# Patient Record
Sex: Female | Born: 1975 | Race: White | Hispanic: No | Marital: Married | Smoking: Never smoker
Health system: Southern US, Community
[De-identification: ages and names within clinical notes are randomized; demographics above are authoritative.]

## PROBLEM LIST (undated history)

## (undated) DIAGNOSIS — F329 Major depressive disorder, single episode, unspecified: Secondary | ICD-10-CM

## (undated) DIAGNOSIS — B977 Papillomavirus as the cause of diseases classified elsewhere: Secondary | ICD-10-CM

## (undated) DIAGNOSIS — F419 Anxiety disorder, unspecified: Secondary | ICD-10-CM

## (undated) DIAGNOSIS — G4730 Sleep apnea, unspecified: Secondary | ICD-10-CM

## (undated) DIAGNOSIS — F32A Depression, unspecified: Secondary | ICD-10-CM

## (undated) DIAGNOSIS — R87629 Unspecified abnormal cytological findings in specimens from vagina: Secondary | ICD-10-CM

## (undated) DIAGNOSIS — F319 Bipolar disorder, unspecified: Secondary | ICD-10-CM

## (undated) DIAGNOSIS — K589 Irritable bowel syndrome without diarrhea: Secondary | ICD-10-CM

## (undated) DIAGNOSIS — R002 Palpitations: Secondary | ICD-10-CM

## (undated) DIAGNOSIS — D649 Anemia, unspecified: Secondary | ICD-10-CM

## (undated) HISTORY — PX: COLONOSCOPY: SHX174

## (undated) HISTORY — DX: Sleep apnea, unspecified: G47.30

---

## 2001-02-16 ENCOUNTER — Other Ambulatory Visit: Admission: RE | Admit: 2001-02-16 | Discharge: 2001-02-16 | Payer: Self-pay | Admitting: Obstetrics and Gynecology

## 2002-02-23 ENCOUNTER — Other Ambulatory Visit: Admission: RE | Admit: 2002-02-23 | Discharge: 2002-02-23 | Payer: Self-pay | Admitting: Obstetrics and Gynecology

## 2003-03-08 ENCOUNTER — Other Ambulatory Visit: Admission: RE | Admit: 2003-03-08 | Discharge: 2003-03-08 | Payer: Self-pay | Admitting: Obstetrics and Gynecology

## 2012-01-16 ENCOUNTER — Other Ambulatory Visit: Payer: Self-pay | Admitting: Orthopedic Surgery

## 2012-01-21 ENCOUNTER — Encounter (HOSPITAL_BASED_OUTPATIENT_CLINIC_OR_DEPARTMENT_OTHER): Payer: Self-pay | Admitting: *Deleted

## 2012-01-22 ENCOUNTER — Encounter (HOSPITAL_BASED_OUTPATIENT_CLINIC_OR_DEPARTMENT_OTHER): Payer: Self-pay | Admitting: *Deleted

## 2012-01-22 ENCOUNTER — Encounter (HOSPITAL_BASED_OUTPATIENT_CLINIC_OR_DEPARTMENT_OTHER)
Admission: RE | Disposition: A | Discharge status: Home / Self Care | Payer: Self-pay | Source: Ambulatory Visit | Attending: Orthopedic Surgery

## 2012-01-22 ENCOUNTER — Encounter (HOSPITAL_BASED_OUTPATIENT_CLINIC_OR_DEPARTMENT_OTHER): Payer: Self-pay | Admitting: Orthopedic Surgery

## 2012-01-22 ENCOUNTER — Ambulatory Visit (HOSPITAL_BASED_OUTPATIENT_CLINIC_OR_DEPARTMENT_OTHER): Payer: Managed Care, Other (non HMO) | Admitting: *Deleted

## 2012-01-22 ENCOUNTER — Ambulatory Visit (HOSPITAL_BASED_OUTPATIENT_CLINIC_OR_DEPARTMENT_OTHER)
Admission: RE | Admit: 2012-01-22 | Discharge: 2012-01-22 | Disposition: A | Discharge status: Home / Self Care | Payer: Managed Care, Other (non HMO) | Source: Ambulatory Visit | Attending: Orthopedic Surgery | Admitting: Orthopedic Surgery

## 2012-01-22 DIAGNOSIS — Z880 Allergy status to penicillin: Secondary | ICD-10-CM | POA: Insufficient documentation

## 2012-01-22 DIAGNOSIS — G5600 Carpal tunnel syndrome, unspecified upper limb: Secondary | ICD-10-CM | POA: Insufficient documentation

## 2012-01-22 DIAGNOSIS — F329 Major depressive disorder, single episode, unspecified: Secondary | ICD-10-CM | POA: Insufficient documentation

## 2012-01-22 DIAGNOSIS — F3289 Other specified depressive episodes: Secondary | ICD-10-CM | POA: Insufficient documentation

## 2012-01-22 DIAGNOSIS — F411 Generalized anxiety disorder: Secondary | ICD-10-CM | POA: Insufficient documentation

## 2012-01-22 HISTORY — DX: Anxiety disorder, unspecified: F41.9

## 2012-01-22 HISTORY — PX: CARPAL TUNNEL RELEASE: SHX101

## 2012-01-22 HISTORY — DX: Anemia, unspecified: D64.9

## 2012-01-22 HISTORY — DX: Depression, unspecified: F32.A

## 2012-01-22 HISTORY — DX: Major depressive disorder, single episode, unspecified: F32.9

## 2012-01-22 SURGERY — CARPAL TUNNEL RELEASE
Anesthesia: Choice | Site: Hand | Laterality: Right | Wound class: Clean

## 2012-01-22 MED ORDER — DEXAMETHASONE SODIUM PHOSPHATE 4 MG/ML IJ SOLN
INTRAMUSCULAR | Status: DC | PRN
  Administered 2012-01-22: 10 mg via INTRAVENOUS

## 2012-01-22 MED ORDER — FENTANYL CITRATE 0.05 MG/ML IJ SOLN: 25.0000 ug | INTRAMUSCULAR | Status: DC | PRN

## 2012-01-22 MED ORDER — HYDROCODONE-ACETAMINOPHEN 5-325 MG PO TABS: ORAL_TABLET | ORAL | Status: DC

## 2012-01-22 MED ORDER — CHLORHEXIDINE GLUCONATE 4 % EX LIQD: 60.0000 mL | Freq: Once | CUTANEOUS | Status: DC

## 2012-01-22 MED ORDER — PROPOFOL 10 MG/ML IV BOLUS
INTRAVENOUS | Status: DC | PRN
  Administered 2012-01-22: 200 mg via INTRAVENOUS

## 2012-01-22 MED ORDER — METOCLOPRAMIDE HCL 5 MG/ML IJ SOLN
INTRAMUSCULAR | Status: DC | PRN
  Administered 2012-01-22: 10 mg via INTRAVENOUS

## 2012-01-22 MED ORDER — GLYCOPYRROLATE 0.2 MG/ML IJ SOLN
0.2000 mg | Freq: Once | INTRAMUSCULAR | Status: AC | PRN
  Administered 2012-01-22: 0.2 mg via INTRAVENOUS

## 2012-01-22 MED ORDER — BUPIVACAINE HCL (PF) 0.25 % IJ SOLN
INTRAMUSCULAR | Status: DC | PRN
  Administered 2012-01-22: 9 mL

## 2012-01-22 MED ORDER — VANCOMYCIN HCL 1000 MG IV SOLR
1000.0000 mg | INTRAVENOUS | Status: DC | PRN
  Administered 2012-01-22: 1000 mg via INTRAVENOUS

## 2012-01-22 MED ORDER — OXYCODONE HCL 5 MG PO TABS: 5.0000 mg | ORAL_TABLET | Freq: Once | ORAL | Status: DC | PRN

## 2012-01-22 MED ORDER — METOCLOPRAMIDE HCL 5 MG/ML IJ SOLN: 10.0000 mg | Freq: Once | INTRAMUSCULAR | Status: DC | PRN

## 2012-01-22 MED ORDER — ONDANSETRON HCL 4 MG/2ML IJ SOLN
INTRAMUSCULAR | Status: DC | PRN
  Administered 2012-01-22 (×2): 4 mg via INTRAVENOUS

## 2012-01-22 MED ORDER — OXYCODONE HCL 5 MG/5ML PO SOLN: 5.0000 mg | Freq: Once | ORAL | Status: DC | PRN

## 2012-01-22 MED ORDER — LIDOCAINE HCL (CARDIAC) 20 MG/ML IV SOLN
INTRAVENOUS | Status: DC | PRN
  Administered 2012-01-22: 50 mg via INTRAVENOUS

## 2012-01-22 MED ORDER — LACTATED RINGERS IV SOLN
INTRAVENOUS | Status: DC
  Administered 2012-01-22 (×3): via INTRAVENOUS

## 2012-01-22 MED ORDER — FENTANYL CITRATE 0.05 MG/ML IJ SOLN
INTRAMUSCULAR | Status: DC | PRN
  Administered 2012-01-22 (×2): 50 ug via INTRAVENOUS

## 2012-01-22 MED ORDER — MIDAZOLAM HCL 5 MG/5ML IJ SOLN
INTRAMUSCULAR | Status: DC | PRN
  Administered 2012-01-22: 2 mg via INTRAVENOUS

## 2012-01-22 SURGICAL SUPPLY — 36 items
BANDAGE ELASTIC 3 VELCRO ST LF (GAUZE/BANDAGES/DRESSINGS) ×2 IMPLANT
BANDAGE GAUZE ELAST BULKY 4 IN (GAUZE/BANDAGES/DRESSINGS) ×2 IMPLANT
BLADE MINI RND TIP GREEN BEAV (BLADE) IMPLANT
BLADE SURG 15 STRL LF DISP TIS (BLADE) ×2 IMPLANT
BLADE SURG 15 STRL SS (BLADE) ×2
BNDG ESMARK 4X9 LF (GAUZE/BANDAGES/DRESSINGS) ×2 IMPLANT
CHLORAPREP W/TINT 26ML (MISCELLANEOUS) ×2 IMPLANT
CLOTH BEACON ORANGE TIMEOUT ST (SAFETY) ×2 IMPLANT
CORDS BIPOLAR (ELECTRODE) ×2 IMPLANT
COVER MAYO STAND STRL (DRAPES) ×2 IMPLANT
COVER TABLE BACK 60X90 (DRAPES) ×2 IMPLANT
CUFF TOURNIQUET SINGLE 18IN (TOURNIQUET CUFF) ×2 IMPLANT
DRAPE EXTREMITY T 121X128X90 (DRAPE) ×2 IMPLANT
DRAPE SURG 17X23 STRL (DRAPES) ×2 IMPLANT
DRSG PAD ABDOMINAL 8X10 ST (GAUZE/BANDAGES/DRESSINGS) ×2 IMPLANT
GAUZE XEROFORM 1X8 LF (GAUZE/BANDAGES/DRESSINGS) ×2 IMPLANT
GLOVE BIO SURGEON STRL SZ 6.5 (GLOVE) ×2 IMPLANT
GLOVE BIO SURGEON STRL SZ7.5 (GLOVE) ×2 IMPLANT
GLOVE BIOGEL PI IND STRL 8 (GLOVE) ×1 IMPLANT
GLOVE BIOGEL PI INDICATOR 8 (GLOVE) ×1
GLOVE INDICATOR 7.0 STRL GRN (GLOVE) ×2 IMPLANT
GOWN PREVENTION PLUS XLARGE (GOWN DISPOSABLE) ×2 IMPLANT
GOWN STRL REIN XL XLG (GOWN DISPOSABLE) ×2 IMPLANT
NEEDLE HYPO 25X1 1.5 SAFETY (NEEDLE) ×2 IMPLANT
NS IRRIG 1000ML POUR BTL (IV SOLUTION) ×2 IMPLANT
PACK BASIN DAY SURGERY FS (CUSTOM PROCEDURE TRAY) ×2 IMPLANT
PADDING CAST ABS 4INX4YD NS (CAST SUPPLIES) ×1
PADDING CAST ABS COTTON 4X4 ST (CAST SUPPLIES) ×1 IMPLANT
SPONGE GAUZE 4X4 12PLY (GAUZE/BANDAGES/DRESSINGS) ×2 IMPLANT
STOCKINETTE 4X48 STRL (DRAPES) ×2 IMPLANT
SUT ETHILON 4 0 PS 2 18 (SUTURE) ×2 IMPLANT
SYR BULB 3OZ (MISCELLANEOUS) ×2 IMPLANT
SYR CONTROL 10ML LL (SYRINGE) ×2 IMPLANT
TOWEL OR 17X24 6PK STRL BLUE (TOWEL DISPOSABLE) ×4 IMPLANT
UNDERPAD 30X30 INCONTINENT (UNDERPADS AND DIAPERS) ×2 IMPLANT
WATER STERILE IRR 1000ML POUR (IV SOLUTION) IMPLANT

## 2012-01-22 NOTE — Anesthesia Procedure Notes (Signed)
Procedure Name: LMA Insertion Date/Time: 01/22/2012 8:55 AM Performed by: Gar Gibbon Pre-anesthesia Checklist: Patient identified, Emergency Drugs available, Suction available and Patient being monitored Patient Re-evaluated:Patient Re-evaluated prior to inductionOxygen Delivery Method: Circle System Utilized Preoxygenation: Pre-oxygenation with 100% oxygen Intubation Type: IV induction Ventilation: Mask ventilation without difficulty LMA: LMA inserted LMA Size: 4.0 Number of attempts: 1 Airway Equipment and Method: bite block Placement Confirmation: positive ETCO2 Tube secured with: Tape Dental Injury: Teeth and Oropharynx as per pre-operative assessment

## 2012-01-22 NOTE — Op Note (Signed)
Dictation [phone]

## 2012-01-22 NOTE — Transfer of Care (Signed)
Immediate Anesthesia Transfer of Care Note  Patient: Stacey Holmes  Procedure(s) Performed: Procedure(s) (LRB) with comments: CARPAL TUNNEL RELEASE (Right) - right carpal tunnel release  Patient Location: PACU  Anesthesia Type: General  Level of Consciousness: sedated  Airway & Oxygen Therapy: Patient Spontanous Breathing and Patient connected to face mask oxygen  Post-op Assessment: Report given to PACU RN  Post vital signs: Reviewed and stable  Complications: No apparent anesthesia complications

## 2012-01-22 NOTE — H&P (Signed)
  Stacey Holmes is an 36 y.o. female.   Chief Complaint: carpal tunnel syndrome HPI: 36 yo rhd female with numbness in thumb, index, long fingers x 2 years.  Progressively worsening.  Nocturnal symptoms 4 times per week.  Positive nerve conduction studies.  Past Medical History  Diagnosis Date  . Anxiety   . Depression   . Anemia     History reviewed. No pertinent past surgical history.  History reviewed. No pertinent family history. Social History:  reports that she has never smoked. She does not have any smokeless tobacco history on file. She reports that she drinks alcohol. She reports that she does not use illicit drugs.  Allergies:  Allergies  Allergen Reactions  . Penicillins Rash    Medications Prior to Admission  Medication Sig Dispense Refill  . carbamazepine (TEGRETOL XR) 400 MG 12 hr tablet Take 400 mg by mouth 2 (two) times daily.      Marland Kitchen escitalopram (LEXAPRO) 20 MG tablet Take 20 mg by mouth daily.        No results found for this or any previous visit (from the past 48 hour(s)).  No results found.   A comprehensive review of systems was negative except for: Eyes: positive for contacts/glasses Behavioral/Psych: positive for depression  Blood pressure 106/71, pulse 67, temperature 97.7 F (36.5 C), temperature source Oral, resp. rate 18, height 5\' 10"  (1.778 m), weight 70.024 kg (154 lb 6 oz), last menstrual period 01/15/2012, SpO2 100.00%.  General appearance: alert, cooperative and appears stated age Head: Normocephalic, without obvious abnormality, atraumatic Neck: supple, symmetrical, trachea midline Resp: clear to auscultation bilaterally Cardio: regular rate and rhythm GI: soft, non-tender; bowel sounds normal; no masses,  no organomegaly Extremities: intact sensation and capillary refill.  +epl/fpl/io.  positive durkins test. Pulses: 2+ and symmetric Skin: Skin color, texture, turgor normal. No rashes or lesions Neurologic: Grossly  normal Incision/Wound: na  Assessment/Plan Right carpal tunnel syndrome.  Discussed non operative and operative treatment options.  Patient wishes to have right carpal tunnel release.  Risks, benefits, and alternatives of surgery were discussed and the patient agrees with the plan of care.   [name] R 01/22/2012, 8:43 AM

## 2012-01-22 NOTE — Anesthesia Preprocedure Evaluation (Signed)
Anesthesia Evaluation  Patient identified by MRN, date of birth, ID band Patient awake    Reviewed: Allergy & Precautions, H&P , NPO status , Patient's Chart, lab work & pertinent test results, reviewed documented beta blocker date and time   Airway Mallampati: II TM Distance: >3 FB Neck ROM: full    Dental   Pulmonary neg pulmonary ROS,  breath sounds clear to auscultation        Cardiovascular negative cardio ROS  Rhythm:regular     Neuro/Psych PSYCHIATRIC DISORDERS negative neurological ROS     GI/Hepatic negative GI ROS, Neg liver ROS,   Endo/Other  negative endocrine ROS  Renal/GU negative Renal ROS  negative genitourinary   Musculoskeletal   Abdominal   Peds  Hematology negative hematology ROS (+)   Anesthesia Other Findings See surgeon's H&P   Reproductive/Obstetrics negative OB ROS                           Anesthesia Physical Anesthesia Plan  ASA: II  Anesthesia Plan: General   Post-op Pain Management:    Induction: Intravenous  Airway Management Planned: LMA  Additional Equipment:   Intra-op Plan:   Post-operative Plan: Extubation in OR  Informed Consent: I have reviewed the patients History and Physical, chart, labs and discussed the procedure including the risks, benefits and alternatives for the proposed anesthesia with the patient or authorized representative who has indicated his/her understanding and acceptance.   Dental Advisory Given  Plan Discussed with: CRNA and Surgeon  Anesthesia Plan Comments:         Anesthesia Quick Evaluation  

## 2012-01-22 NOTE — Anesthesia Postprocedure Evaluation (Signed)
Anesthesia Post Note  Patient: Stacey Holmes  Procedure(s) Performed: Procedure(s) (LRB): CARPAL TUNNEL RELEASE (Right)  Anesthesia type: General  Patient location: PACU  Post pain: Pain level controlled  Post assessment: Patient's Cardiovascular Status Stable  Last Vitals:  Filed Vitals:   01/22/12 1031  BP: 100/67  Pulse: 69  Temp: 36.6 C  Resp: 16    Post vital signs: Reviewed and stable  Level of consciousness: alert  Complications: No apparent anesthesia complications

## 2012-01-23 ENCOUNTER — Encounter (HOSPITAL_BASED_OUTPATIENT_CLINIC_OR_DEPARTMENT_OTHER): Payer: Self-pay | Admitting: Orthopedic Surgery

## 2012-01-23 NOTE — Op Note (Signed)
Stacey Holmes, Stacey Holmes NO.:  [id number]  MEDICAL RECORD NO.:  [id number]  LOCATION:                                 FACILITY:  PHYSICIAN:  Betha Loa, MD             DATE OF BIRTH:  DATE OF PROCEDURE:  01/22/2012 DATE OF DISCHARGE:                              OPERATIVE REPORT   PREOPERATIVE DIAGNOSIS:  Right carpal tunnel syndrome.  POSTOPERATIVE DIAGNOSIS:  Right carpal tunnel syndrome.  PROCEDURE:  Right carpal tunnel release.  SURGEON:  Betha Loa, MD  ASSISTANT:  None.  ANESTHESIA:  General.  IV FLUIDS:  Per anesthesia flow sheet.  ESTIMATED BLOOD LOSS:  Minimal.  COMPLICATIONS:  None.  SPECIMENS:  None.  TOURNIQUET TIME:  17 minutes.  DISPOSITION:  Stable to PACU.  INDICATIONS:  Ms. Stacey Holmes is a 36 year old right-hand dominant female who has had numbness in the thumb, index, and long fingers.  She has nocturnal symptoms.  She has positive nerve conduction studies.  She would like to have right carpal tunnel release for management of symptoms.  Risks, benefits, and alternatives of surgery were discussed including risk of blood loss, infection, damage to nerves, vessels, tendons, ligaments, bone, failure of surgery, need for additional surgery, complications with wound healing, continued pain, continued carpal tunnel syndrome.  She voiced understanding of these risks and elected to proceed.  OPERATIVE COURSE:  After being identified preoperatively by myself, the patient and I agreed upon the procedure and site of procedure.  Surgical site was marked.  The risks, benefits, and alternatives of surgery were reviewed, and she wished to proceed.  Surgical consent had been signed. She was given 1 g of IV vancomycin as preoperative antibiotic prophylaxis due to penicillin allergy.  She was transported to the operating room, placed on the operating table in a supine position with the right upper extremity on arm board.  General anesthesia was  induced by the anesthesiologist.  The right upper extremity was prepped and draped in normal sterile orthopedic fashion.  Surgical pause was performed between surgeons, anesthesia, and operating room staff, and all were in agreement as to the patient, procedure, and site of procedure.  Tourniquet at the proximal aspect of the forearm was inflated to 250 mmHg after exsanguination of the limb with an Esmarch bandage.  An incision was made longitudinally over the transverse carpal ligament, carried into subcutaneous tissues by spreading technique. Bipolar electrocautery was used to obtain hemostasis.  The transverse carpal ligament was identified and was incised sharply.  It was incised distally.  Care was taken to ensure complete decompression distally.  It was then incised in a proximal direction.  The distal aspect of the volar antebrachial fascia was split using the scissors.  A finger was placed into the wound to ensure complete decompression, which was the case.  The nerve was inspected.  It was adherent to the undersurface of the radial leaflet.  The motor branch was identified and was intact. The nerve was slightly hyperemic.  There were no masses noted.  The wound was copiously irrigated with sterile saline.  It was closed with 4- 0 nylon in a horizontal mattress fashion.  It was injected with 9 mL of 0.25% plain Marcaine to aid in postoperative analgesia.  The wound was then dressed with sterile Xeroform, 4 x 4s, and ABD and wrapped with Kerlix and Ace bandage.  Tourniquet was deflated at 17 minutes.  The fingertips were pink with brisk capillary refill after deflation of tourniquet.  Operative drapes were broken down.  The patient was awoken from anesthesia safely.  She was transferred back to stretcher and taken to PACU in stable condition.  I will see her in the office in 1 week for postoperative followup.  I will give her Norco 5/325 1-2 p.o. q.6 hours p.r.n. pain, dispensed  #30.     Betha Loa, MD     KK/MEDQ  D:  01/22/2012  T:  01/23/2012  Job:  [id number]

## 2016-07-15 ENCOUNTER — Other Ambulatory Visit (HOSPITAL_COMMUNITY): Payer: Self-pay | Admitting: Obstetrics and Gynecology

## 2016-07-15 DIAGNOSIS — Z319 Encounter for procreative management, unspecified: Secondary | ICD-10-CM

## 2016-07-21 ENCOUNTER — Other Ambulatory Visit (HOSPITAL_COMMUNITY): Payer: Self-pay | Admitting: Obstetrics and Gynecology

## 2016-07-21 ENCOUNTER — Ambulatory Visit (HOSPITAL_COMMUNITY)
Admission: RE | Admit: 2016-07-21 | Discharge: 2016-07-21 | Disposition: A | Discharge status: Home / Self Care | Payer: PRIVATE HEALTH INSURANCE | Source: Ambulatory Visit | Attending: Obstetrics and Gynecology | Admitting: Obstetrics and Gynecology

## 2016-07-21 DIAGNOSIS — Z319 Encounter for procreative management, unspecified: Secondary | ICD-10-CM

## 2016-07-21 DIAGNOSIS — N979 Female infertility, unspecified: Secondary | ICD-10-CM | POA: Insufficient documentation

## 2016-07-21 MED ORDER — IOPAMIDOL (ISOVUE-300) INJECTION 61%
30.0000 mL | Freq: Once | INTRAVENOUS | Status: AC | PRN
  Administered 2016-07-21: 30 mL via INTRAVENOUS

## 2016-08-18 ENCOUNTER — Other Ambulatory Visit (HOSPITAL_COMMUNITY): Payer: Self-pay | Admitting: Obstetrics and Gynecology

## 2016-08-18 DIAGNOSIS — Z319 Encounter for procreative management, unspecified: Secondary | ICD-10-CM

## 2016-08-20 ENCOUNTER — Ambulatory Visit (HOSPITAL_COMMUNITY)
Admission: RE | Admit: 2016-08-20 | Discharge: 2016-08-20 | Disposition: A | Discharge status: Home / Self Care | Payer: PRIVATE HEALTH INSURANCE | Source: Ambulatory Visit | Attending: Obstetrics and Gynecology | Admitting: Obstetrics and Gynecology

## 2016-08-20 DIAGNOSIS — Z319 Encounter for procreative management, unspecified: Secondary | ICD-10-CM | POA: Diagnosis present

## 2016-08-20 MED ORDER — IOPAMIDOL (ISOVUE-300) INJECTION 61%
30.0000 mL | Freq: Once | INTRAVENOUS | Status: AC | PRN
  Administered 2016-08-20: 30 mL

## 2016-12-25 ENCOUNTER — Other Ambulatory Visit (HOSPITAL_COMMUNITY): Payer: Self-pay | Admitting: Obstetrics and Gynecology

## 2016-12-25 DIAGNOSIS — Z36 Encounter for antenatal screening for chromosomal anomalies: Secondary | ICD-10-CM

## 2016-12-30 ENCOUNTER — Encounter (HOSPITAL_COMMUNITY): Payer: Self-pay | Admitting: *Deleted

## 2016-12-31 ENCOUNTER — Ambulatory Visit (HOSPITAL_COMMUNITY)
Admission: RE | Admit: 2016-12-31 | Discharge: 2016-12-31 | Disposition: A | Discharge status: Home / Self Care | Payer: PRIVATE HEALTH INSURANCE | Source: Ambulatory Visit | Attending: Obstetrics and Gynecology | Admitting: Obstetrics and Gynecology

## 2016-12-31 ENCOUNTER — Encounter (HOSPITAL_COMMUNITY): Payer: Self-pay

## 2016-12-31 ENCOUNTER — Other Ambulatory Visit (HOSPITAL_COMMUNITY): Payer: Self-pay | Admitting: Obstetrics and Gynecology

## 2016-12-31 ENCOUNTER — Encounter (HOSPITAL_COMMUNITY): Payer: Managed Care, Other (non HMO)

## 2016-12-31 DIAGNOSIS — O99341 Other mental disorders complicating pregnancy, first trimester: Secondary | ICD-10-CM | POA: Insufficient documentation

## 2016-12-31 DIAGNOSIS — Z36 Encounter for antenatal screening for chromosomal anomalies: Secondary | ICD-10-CM | POA: Diagnosis not present

## 2016-12-31 DIAGNOSIS — O09811 Supervision of pregnancy resulting from assisted reproductive technology, first trimester: Secondary | ICD-10-CM | POA: Insufficient documentation

## 2016-12-31 DIAGNOSIS — O09511 Supervision of elderly primigravida, first trimester: Secondary | ICD-10-CM | POA: Diagnosis not present

## 2016-12-31 DIAGNOSIS — Z3A12 12 weeks gestation of pregnancy: Secondary | ICD-10-CM | POA: Insufficient documentation

## 2016-12-31 DIAGNOSIS — O281 Abnormal biochemical finding on antenatal screening of mother: Secondary | ICD-10-CM | POA: Diagnosis not present

## 2016-12-31 DIAGNOSIS — O289 Unspecified abnormal findings on antenatal screening of mother: Secondary | ICD-10-CM | POA: Insufficient documentation

## 2016-12-31 HISTORY — DX: Irritable bowel syndrome, unspecified: K58.9

## 2016-12-31 HISTORY — DX: Bipolar disorder, unspecified: F31.9

## 2017-01-01 ENCOUNTER — Encounter (HOSPITAL_COMMUNITY): Payer: Self-pay

## 2017-01-01 ENCOUNTER — Other Ambulatory Visit: Payer: Self-pay | Admitting: Obstetrics and Gynecology

## 2017-01-02 ENCOUNTER — Encounter (HOSPITAL_COMMUNITY): Payer: Self-pay | Admitting: Anesthesiology

## 2017-01-02 ENCOUNTER — Ambulatory Visit (HOSPITAL_COMMUNITY)
Admission: RE | Admit: 2017-01-02 | Payer: PRIVATE HEALTH INSURANCE | Source: Ambulatory Visit | Admitting: Obstetrics and Gynecology

## 2017-01-02 HISTORY — DX: Palpitations: R00.2

## 2017-01-02 SURGERY — DILATION AND EVACUATION, UTERUS
Anesthesia: Choice

## 2017-01-14 ENCOUNTER — Encounter (HOSPITAL_COMMUNITY): Payer: Self-pay

## 2017-01-14 ENCOUNTER — Other Ambulatory Visit (HOSPITAL_COMMUNITY): Payer: Self-pay

## 2017-05-18 ENCOUNTER — Inpatient Hospital Stay (HOSPITAL_COMMUNITY)
Admission: AD | Admit: 2017-05-18 | Payer: PRIVATE HEALTH INSURANCE | Source: Ambulatory Visit | Admitting: Obstetrics and Gynecology

## 2017-10-06 ENCOUNTER — Encounter (HOSPITAL_COMMUNITY): Payer: Self-pay

## 2018-04-07 ENCOUNTER — Encounter (HOSPITAL_COMMUNITY): Payer: Self-pay

## 2018-04-13 LAB — OB RESULTS CONSOLE RPR: RPR: NONREACTIVE

## 2018-04-13 LAB — OB RESULTS CONSOLE RUBELLA ANTIBODY, IGM: Rubella: IMMUNE

## 2018-04-13 LAB — OB RESULTS CONSOLE ABO/RH: RH Type: POSITIVE

## 2018-04-13 LAB — OB RESULTS CONSOLE HEPATITIS B SURFACE ANTIGEN: Hepatitis B Surface Ag: NEGATIVE

## 2018-04-13 LAB — OB RESULTS CONSOLE HIV ANTIBODY (ROUTINE TESTING): HIV: NONREACTIVE

## 2018-04-13 LAB — OB RESULTS CONSOLE ANTIBODY SCREEN: Antibody Screen: NEGATIVE

## 2018-05-19 NOTE — L&D Delivery Note (Signed)
Delivery Note At 9:48 PM a viable and healthy female was delivered via Vaginal, Spontaneous (Presentation: LOA  ).  APGAR: 8, 9; weight pending .   Placenta status: manual removal, intact.  Cord:  with the following complications: [place] x 2 reduced.  Cord pH: na  Anesthesia:  epidural Episiotomy: None Lacerations: 2nd degree;Vaginal Suture Repair: 2.0 vicryl rapide Est. Blood Loss (mL): 131  Mom to postpartum.  Baby to Couplet care / Skin to Skin.  Salia Cangemi J 11/11/2018, 10:05 PM

## 2018-06-10 ENCOUNTER — Other Ambulatory Visit: Payer: Self-pay

## 2018-06-18 ENCOUNTER — Encounter (HOSPITAL_COMMUNITY): Payer: Self-pay

## 2018-06-23 ENCOUNTER — Encounter (HOSPITAL_COMMUNITY): Payer: Self-pay

## 2018-06-23 ENCOUNTER — Other Ambulatory Visit (HOSPITAL_COMMUNITY): Payer: Self-pay | Admitting: Obstetrics and Gynecology

## 2018-06-23 DIAGNOSIS — O285 Abnormal chromosomal and genetic finding on antenatal screening of mother: Secondary | ICD-10-CM

## 2018-06-25 ENCOUNTER — Encounter (HOSPITAL_COMMUNITY): Payer: Self-pay

## 2018-06-25 ENCOUNTER — Encounter (HOSPITAL_COMMUNITY): Payer: PRIVATE HEALTH INSURANCE

## 2018-06-25 ENCOUNTER — Ambulatory Visit (HOSPITAL_COMMUNITY)
Admission: RE | Admit: 2018-06-25 | Discharge: 2018-06-25 | Disposition: A | Discharge status: Home / Self Care | Payer: 59 | Source: Ambulatory Visit | Attending: Family Medicine | Admitting: Family Medicine

## 2018-06-25 DIAGNOSIS — O285 Abnormal chromosomal and genetic finding on antenatal screening of mother: Secondary | ICD-10-CM

## 2018-06-25 DIAGNOSIS — Z3A19 19 weeks gestation of pregnancy: Secondary | ICD-10-CM | POA: Diagnosis not present

## 2018-06-25 DIAGNOSIS — Z363 Encounter for antenatal screening for malformations: Secondary | ICD-10-CM

## 2018-06-25 DIAGNOSIS — O09299 Supervision of pregnancy with other poor reproductive or obstetric history, unspecified trimester: Secondary | ICD-10-CM | POA: Diagnosis not present

## 2018-06-25 HISTORY — DX: Bipolar disorder, unspecified: F31.9

## 2018-06-25 HISTORY — DX: Papillomavirus as the cause of diseases classified elsewhere: B97.7

## 2018-06-25 HISTORY — DX: Unspecified abnormal cytological findings in specimens from vagina: R87.629

## 2018-10-19 LAB — OB RESULTS CONSOLE GBS: GBS: NEGATIVE

## 2018-11-02 ENCOUNTER — Other Ambulatory Visit: Payer: Self-pay | Admitting: Obstetrics and Gynecology

## 2018-11-02 ENCOUNTER — Telehealth (HOSPITAL_COMMUNITY): Payer: Self-pay | Admitting: *Deleted

## 2018-11-02 NOTE — Telephone Encounter (Signed)
Preadmission screen  

## 2018-11-03 ENCOUNTER — Encounter (HOSPITAL_COMMUNITY): Payer: Self-pay | Admitting: *Deleted

## 2018-11-03 ENCOUNTER — Telehealth (HOSPITAL_COMMUNITY): Payer: Self-pay | Admitting: *Deleted

## 2018-11-03 NOTE — Telephone Encounter (Signed)
Preadmission screen  

## 2018-11-09 ENCOUNTER — Other Ambulatory Visit: Payer: Self-pay | Admitting: Obstetrics and Gynecology

## 2018-11-10 ENCOUNTER — Other Ambulatory Visit: Payer: Self-pay

## 2018-11-10 ENCOUNTER — Other Ambulatory Visit (HOSPITAL_COMMUNITY)
Admission: RE | Admit: 2018-11-10 | Discharge: 2018-11-10 | Disposition: A | Discharge status: Home / Self Care | Payer: 59 | Source: Ambulatory Visit | Attending: Obstetrics | Admitting: Obstetrics

## 2018-11-10 DIAGNOSIS — Z1159 Encounter for screening for other viral diseases: Secondary | ICD-10-CM | POA: Diagnosis not present

## 2018-11-10 LAB — SARS CORONAVIRUS 2 (TAT 6-24 HRS): SARS Coronavirus 2: NEGATIVE

## 2018-11-10 NOTE — MAU Note (Signed)
Covid swab collected. PT tolerated well.Pt asymptomatic

## 2018-11-11 ENCOUNTER — Inpatient Hospital Stay (HOSPITAL_COMMUNITY): Payer: 59 | Admitting: Anesthesiology

## 2018-11-11 ENCOUNTER — Other Ambulatory Visit: Payer: Self-pay

## 2018-11-11 ENCOUNTER — Encounter (HOSPITAL_COMMUNITY): Payer: Self-pay

## 2018-11-11 ENCOUNTER — Inpatient Hospital Stay (HOSPITAL_COMMUNITY)
Admission: AD | Admit: 2018-11-11 | Discharge: 2018-11-13 | DRG: 807 | Disposition: A | Discharge status: Home / Self Care | Payer: 59 | Attending: Obstetrics and Gynecology | Admitting: Obstetrics and Gynecology

## 2018-11-11 DIAGNOSIS — O26893 Other specified pregnancy related conditions, third trimester: Secondary | ICD-10-CM | POA: Diagnosis present

## 2018-11-11 DIAGNOSIS — Z3A39 39 weeks gestation of pregnancy: Secondary | ICD-10-CM | POA: Diagnosis not present

## 2018-11-11 DIAGNOSIS — O4292 Full-term premature rupture of membranes, unspecified as to length of time between rupture and onset of labor: Secondary | ICD-10-CM

## 2018-11-11 DIAGNOSIS — Z3689 Encounter for other specified antenatal screening: Secondary | ICD-10-CM

## 2018-11-11 LAB — CBC
HCT: 47.2 % — ABNORMAL HIGH (ref 36.0–46.0)
Hemoglobin: 16.2 g/dL — ABNORMAL HIGH (ref 12.0–15.0)
MCH: 34.8 pg — ABNORMAL HIGH (ref 26.0–34.0)
MCHC: 34.3 g/dL (ref 30.0–36.0)
MCV: 101.5 fL — ABNORMAL HIGH (ref 80.0–100.0)
Platelets: 128 10*3/uL — ABNORMAL LOW (ref 150–400)
RBC: 4.65 MIL/uL (ref 3.87–5.11)
RDW: 13 % (ref 11.5–15.5)
WBC: 7.2 10*3/uL (ref 4.0–10.5)
nRBC: 0 % (ref 0.0–0.2)

## 2018-11-11 LAB — TYPE AND SCREEN
ABO/RH(D): A POS
Antibody Screen: NEGATIVE

## 2018-11-11 LAB — AMNISURE RUPTURE OF MEMBRANE (ROM) NOT AT ARMC: Amnisure ROM: POSITIVE

## 2018-11-11 LAB — POCT FERN TEST: POCT Fern Test: NEGATIVE

## 2018-11-11 LAB — ABO/RH: ABO/RH(D): A POS

## 2018-11-11 MED ORDER — LIDOCAINE-EPINEPHRINE (PF) 2 %-1:200000 IJ SOLN
INTRAMUSCULAR | Status: DC | PRN
  Administered 2018-11-11 (×2): 3 mL via EPIDURAL

## 2018-11-11 MED ORDER — TERBUTALINE SULFATE 1 MG/ML IJ SOLN: 0.2500 mg | Freq: Once | INTRAMUSCULAR | Status: DC | PRN

## 2018-11-11 MED ORDER — PHENYLEPHRINE 40 MCG/ML (10ML) SYRINGE FOR IV PUSH (FOR BLOOD PRESSURE SUPPORT): 80.0000 ug | PREFILLED_SYRINGE | INTRAVENOUS | Status: DC | PRN

## 2018-11-11 MED ORDER — LIDOCAINE HCL (PF) 1 % IJ SOLN
INTRAMUSCULAR | Status: AC
  Administered 2018-11-11: 30 mL
  Filled 2018-11-11: qty 30

## 2018-11-11 MED ORDER — LACTATED RINGERS IV SOLN
INTRAVENOUS | Status: DC
  Administered 2018-11-11 (×2): via INTRAVENOUS

## 2018-11-11 MED ORDER — EPHEDRINE 5 MG/ML INJ: 10.0000 mg | INTRAVENOUS | Status: DC | PRN

## 2018-11-11 MED ORDER — CEFAZOLIN SODIUM-DEXTROSE 2-4 GM/100ML-% IV SOLN
2.0000 g | Freq: Three times a day (TID) | INTRAVENOUS | Status: DC
  Administered 2018-11-11: 2 g via INTRAVENOUS
  Filled 2018-11-11 (×2): qty 100

## 2018-11-11 MED ORDER — OXYTOCIN BOLUS FROM INFUSION
500.0000 mL | Freq: Once | INTRAVENOUS | Status: AC
  Administered 2018-11-11: 500 mL via INTRAVENOUS

## 2018-11-11 MED ORDER — ACETAMINOPHEN 325 MG PO TABS: 650.0000 mg | ORAL_TABLET | ORAL | Status: DC | PRN

## 2018-11-11 MED ORDER — FLEET ENEMA 7-19 GM/118ML RE ENEM: 1.0000 | ENEMA | RECTAL | Status: DC | PRN

## 2018-11-11 MED ORDER — FENTANYL-BUPIVACAINE-NACL 0.5-0.125-0.9 MG/250ML-% EP SOLN
12.0000 mL/h | EPIDURAL | Status: DC | PRN
  Filled 2018-11-11: qty 250

## 2018-11-11 MED ORDER — IBUPROFEN 600 MG PO TABS
600.0000 mg | ORAL_TABLET | Freq: Four times a day (QID) | ORAL | Status: DC
  Administered 2018-11-11 – 2018-11-13 (×8): 600 mg via ORAL
  Filled 2018-11-11 (×8): qty 1

## 2018-11-11 MED ORDER — DIPHENHYDRAMINE HCL 50 MG/ML IJ SOLN: 12.5000 mg | INTRAMUSCULAR | Status: DC | PRN

## 2018-11-11 MED ORDER — OXYTOCIN 40 UNITS IN NORMAL SALINE INFUSION - SIMPLE MED
2.5000 [IU]/h | INTRAVENOUS | Status: DC
  Administered 2018-11-11: 2.5 [IU]/h via INTRAVENOUS

## 2018-11-11 MED ORDER — LACTATED RINGERS IV SOLN: 500.0000 mL | INTRAVENOUS | Status: DC | PRN

## 2018-11-11 MED ORDER — OXYTOCIN 40 UNITS IN NORMAL SALINE INFUSION - SIMPLE MED
1.0000 m[IU]/min | INTRAVENOUS | Status: DC
  Administered 2018-11-11: 2 m[IU]/min via INTRAVENOUS
  Filled 2018-11-11: qty 1000

## 2018-11-11 MED ORDER — SODIUM CHLORIDE (PF) 0.9 % IJ SOLN
INTRAMUSCULAR | Status: DC | PRN
  Administered 2018-11-11: 12 mL/h via EPIDURAL

## 2018-11-11 MED ORDER — SOD CITRATE-CITRIC ACID 500-334 MG/5ML PO SOLN: 30.0000 mL | ORAL | Status: DC | PRN

## 2018-11-11 MED ORDER — LACTATED RINGERS IV SOLN
500.0000 mL | Freq: Once | INTRAVENOUS | Status: AC
  Administered 2018-11-11: 500 mL via INTRAVENOUS

## 2018-11-11 MED ORDER — OXYCODONE-ACETAMINOPHEN 5-325 MG PO TABS: 2.0000 | ORAL_TABLET | ORAL | Status: DC | PRN

## 2018-11-11 MED ORDER — ONDANSETRON HCL 4 MG/2ML IJ SOLN: 4.0000 mg | Freq: Four times a day (QID) | INTRAMUSCULAR | Status: DC | PRN

## 2018-11-11 MED ORDER — LIDOCAINE HCL (PF) 1 % IJ SOLN: 30.0000 mL | INTRAMUSCULAR | Status: DC | PRN

## 2018-11-11 MED ORDER — OXYCODONE-ACETAMINOPHEN 5-325 MG PO TABS: 1.0000 | ORAL_TABLET | ORAL | Status: DC | PRN

## 2018-11-11 NOTE — H&P (Signed)
Stacey Holmes is a 43 y.o. female presenting for SROM at term. OB History    Gravida  2   Para      Term      Preterm      AB  1   Living  0     SAB  1   TAB      Ectopic      Multiple      Live Births             Past Medical History:  Diagnosis Date  . Anemia   . Anxiety   . Bipolar depression ([place])   . Bipolar disorder ([place])   . Depression   . Heart palpitations   . HPV in female   . IBS (irritable bowel syndrome)   . Vaginal Pap smear, abnormal    Past Surgical History:  Procedure Laterality Date  . CARPAL TUNNEL RELEASE  01/22/2012   Procedure: CARPAL TUNNEL RELEASE;  Surgeon: Tennis Must, MD;  Location: [facility];  Service: Orthopedics;  Laterality: Right;  right carpal tunnel release   Family History: family history includes Hypertension in her mother. Social History:  reports that she has never smoked. She has never used smokeless tobacco. She reports previous alcohol use. She reports that she does not use drugs.     Maternal Diabetes: No Genetic Screening: Normal Maternal Ultrasounds/Referrals: Normal Fetal Ultrasounds or other Referrals:  None Maternal Substance Abuse:  No Significant Maternal Medications:  None Significant Maternal Lab Results:  None Other Comments:  None  Review of Systems  Constitutional: Negative.   All other systems reviewed and are negative.  Maternal Medical History:  Reason for admission: Rupture of membranes and contractions.   Contractions: Onset was 1-2 hours ago.   Perceived severity is moderate.    Fetal activity: Perceived fetal activity is normal.   Last perceived fetal movement was within the past hour.    Prenatal complications: no prenatal complications Prenatal Complications - Diabetes: none.    Dilation: 3.5 Effacement (%): 80 Station: -2, -1 Exam by:: Polos RN Blood pressure 104/83, pulse 69, temperature 98.9 F (37.2 C), temperature source Oral, resp. rate 16, height 5'  10" (1.778 m), weight 80.3 kg, last menstrual period 02/10/2018, unknown if currently breastfeeding. Maternal Exam:  Uterine Assessment: Contraction strength is moderate.  Contraction frequency is irregular.   Abdomen: Patient reports no abdominal tenderness. Fetal presentation: vertex  Introitus: Normal vulva. Normal vagina.  Ferning test: positive.  Nitrazine test: positive. Amniotic fluid character: clear.  Pelvis: adequate for delivery.   Cervix: Cervix evaluated by digital exam.     Physical Exam  Nursing note and vitals reviewed. Constitutional: She is oriented to person, place, and time. She appears well-developed and well-nourished.  HENT:  Head: Normocephalic and atraumatic.  Neck: Normal range of motion. Neck supple.  Cardiovascular: Normal rate and regular rhythm.  Respiratory: Effort normal and breath sounds normal.  GI: Soft. Bowel sounds are normal.  Genitourinary:    Vulva, vagina and uterus normal.   Musculoskeletal: Normal range of motion.  Neurological: She is alert and oriented to person, place, and time. She has normal reflexes.  Skin: Skin is warm and dry.  Psychiatric: She has a normal mood and affect.    Prenatal labs: ABO, Rh: --/--/A POS (06/25 1121) Antibody: NEG (06/25 1121) Rubella: Immune (11/26 0000) RPR: Nonreactive (11/26 0000)  HBsAg: Negative (11/26 0000)  HIV: Non-reactive (11/26 0000)  GBS: Negative (06/02 0000)  Assessment/Plan: Term IUP with SROM AMA Augment labor   Shaheed Schmuck J 11/11/2018, 1:37 PM

## 2018-11-11 NOTE — MAU Note (Signed)
Water broke at 6 am.  Clear fluid with slight pink. Baby moving well. No pain, no contractions.

## 2018-11-11 NOTE — Anesthesia Procedure Notes (Signed)
Epidural Patient location during procedure: OB Start time: 11/11/2018 5:05 PM End time: 11/11/2018 5:20 PM  Staffing Anesthesiologist: Freddrick March, MD Performed: anesthesiologist   Preanesthetic Checklist Completed: patient identified, pre-op evaluation, timeout performed, IV checked, risks and benefits discussed and monitors and equipment checked  Epidural Patient position: sitting Prep: site prepped and draped and DuraPrep Patient monitoring: continuous pulse ox, blood pressure, heart rate and cardiac monitor Approach: midline Location: L3-L4 Injection technique: LOR air  Needle:  Needle type: Tuohy  Needle gauge: 17 G Needle length: 9 cm Needle insertion depth: 5 cm Catheter type: closed end flexible Catheter size: 19 Gauge Catheter at skin depth: 11 cm Test dose: negative  Assessment Sensory level: T8 Events: blood not aspirated, injection not painful, no injection resistance, negative IV test and no paresthesia  Additional Notes Patient identified. Risks/Benefits/Options discussed with patient including but not limited to bleeding, infection, nerve damage, paralysis, failed block, incomplete pain control, headache, blood pressure changes, nausea, vomiting, reactions to medication both or allergic, itching and postpartum back pain. Confirmed with bedside nurse the patient's most recent platelet count. Confirmed with patient that they are not currently taking any anticoagulation, have any bleeding history or any family history of bleeding disorders. Patient expressed understanding and wished to proceed. All questions were answered. Sterile technique was used throughout the entire procedure. Please see nursing notes for vital signs. Test dose was given through epidural catheter and negative prior to continuing to dose epidural or start infusion. Warning signs of high block given to the patient including shortness of breath, tingling/numbness in hands, complete motor block,  or any concerning symptoms with instructions to call for help. Patient was given instructions on fall risk and not to get out of bed. All questions and concerns addressed with instructions to call with any issues or inadequate analgesia.  Reason for block:procedure for pain

## 2018-11-11 NOTE — Anesthesia Preprocedure Evaluation (Signed)
Anesthesia Evaluation  Patient identified by MRN, date of birth, ID band Patient awake    Reviewed: Allergy & Precautions, NPO status , Patient's Chart, lab work & pertinent test results  Airway Mallampati: II  TM Distance: >3 FB Neck ROM: Full    Dental no notable dental hx.    Pulmonary neg pulmonary ROS,    Pulmonary exam normal breath sounds clear to auscultation       Cardiovascular negative cardio ROS Normal cardiovascular exam Rhythm:Regular Rate:Normal     Neuro/Psych PSYCHIATRIC DISORDERS Anxiety Depression Bipolar Disorder negative neurological ROS     GI/Hepatic negative GI ROS, Neg liver ROS,   Endo/Other  negative endocrine ROS  Renal/GU negative Renal ROS  negative genitourinary   Musculoskeletal negative musculoskeletal ROS (+)   Abdominal   Peds  Hematology   Anesthesia Other Findings   Reproductive/Obstetrics (+) Pregnancy                             Anesthesia Physical Anesthesia Plan  ASA: II  Anesthesia Plan: Epidural   Post-op Pain Management:    Induction:   PONV Risk Score and Plan: Treatment may vary due to age or medical condition  Airway Management Planned: Natural Airway  Additional Equipment:   Intra-op Plan:   Post-operative Plan:   Informed Consent: I have reviewed the patients History and Physical, chart, labs and discussed the procedure including the risks, benefits and alternatives for the proposed anesthesia with the patient or authorized representative who has indicated his/her understanding and acceptance.       Plan Discussed with: Anesthesiologist  Anesthesia Plan Comments: (Patient identified. Risks, benefits, options discussed with patient including but not limited to bleeding, infection, nerve damage, paralysis, failed block, incomplete pain control, headache, blood pressure changes, nausea, vomiting, reactions to medication,  itching, and post partum back pain. Confirmed with bedside nurse the patient's most recent platelet count. Confirmed with the patient that they are not taking any anticoagulation, have any bleeding history or any family history of bleeding disorders. Patient expressed understanding and wishes to proceed. All questions were answered. )        Anesthesia Quick Evaluation

## 2018-11-11 NOTE — MAU Provider Note (Signed)
S: Ms. Stacey Holmes is a 43 y.o. G2P0010 at [redacted]w[redacted]d  who presents to MAU today complaining of leaking of fluid since 0600 today. She denies vaginal bleeding, but reports very minimal pink discharge after leaking of fluid. She denies contractions. She reports normal fetal movement.    O: BP 137/77 (BP Location: Right Arm)   Pulse 71   Temp 98.5 F (36.9 C) (Oral)   Resp 18   Ht 5\' 10"  (1.778 m)   Wt 80.3 kg   LMP 02/10/2018   BMI 25.40 kg/m  GENERAL: Well-developed, well-nourished female in no acute distress.  HEAD: Normocephalic, atraumatic.  CHEST: Normal effort of breathing, regular heart rate ABDOMEN: Soft, nontender, gravid PELVIC: Normal external female genitalia. Vagina is pink and rugated. Cervix with normal contour, no lesions. Normal discharge.  No pooling. No bleeding.  Cervical exam: 2.5/50/0    Fetal Monitoring: reactive with variables Baseline: 140/150 Variability: moderate Accelerations: present, 15x15 Decelerations: present, few, irregular variable Contractions: few, mild irregular  Results for orders placed or performed during the hospital encounter of 11/11/18 (from the past 24 hour(s))  Amnisure rupture of membrane (rom)not at [facility]     Status: None   Collection Time: 11/11/18  9:27 AM  Result Value Ref Range   Amnisure ROM POSITIVE   POCT fern test     Status: None   Collection Time: 11/11/18  9:58 AM  Result Value Ref Range   POCT Fern Test Negative = intact amniotic membranes     A: SIUP at [redacted]w[redacted]d No pooling Fern negative x2 Amnisure: POSITIVE (of note, nurse reported when she collected the AmniSure that the swab came out brown) SROM  P: Report given to RN to contact MD on call for further instructions  Meredyth Hornung, Gerrie Nordmann, NP 11/11/2018 10:30 AM

## 2018-11-12 ENCOUNTER — Inpatient Hospital Stay (HOSPITAL_COMMUNITY): Payer: 59

## 2018-11-12 ENCOUNTER — Inpatient Hospital Stay (HOSPITAL_COMMUNITY): Admission: AD | Admit: 2018-11-12 | Payer: 59 | Source: Home / Self Care | Admitting: Obstetrics and Gynecology

## 2018-11-12 LAB — CBC
HCT: 37.1 % (ref 36.0–46.0)
HCT: 33.7 % — ABNORMAL LOW (ref 36.0–46.0)
Hemoglobin: 12.6 g/dL (ref 12.0–15.0)
Hemoglobin: 11.3 g/dL — ABNORMAL LOW (ref 12.0–15.0)
MCH: 34.4 pg — ABNORMAL HIGH (ref 26.0–34.0)
MCH: 33.9 pg (ref 26.0–34.0)
MCHC: 34 g/dL (ref 30.0–36.0)
MCHC: 33.5 g/dL (ref 30.0–36.0)
MCV: 101.4 fL — ABNORMAL HIGH (ref 80.0–100.0)
MCV: 101.2 fL — ABNORMAL HIGH (ref 80.0–100.0)
Platelets: 138 10*3/uL — ABNORMAL LOW (ref 150–400)
Platelets: 130 10*3/uL — ABNORMAL LOW (ref 150–400)
RBC: 3.66 MIL/uL — ABNORMAL LOW (ref 3.87–5.11)
RBC: 3.33 MIL/uL — ABNORMAL LOW (ref 3.87–5.11)
RDW: 12.8 % (ref 11.5–15.5)
RDW: 12.9 % (ref 11.5–15.5)
WBC: 11.4 10*3/uL — ABNORMAL HIGH (ref 4.0–10.5)
WBC: 11.8 10*3/uL — ABNORMAL HIGH (ref 4.0–10.5)
nRBC: 0 % (ref 0.0–0.2)
nRBC: 0 % (ref 0.0–0.2)

## 2018-11-12 LAB — RPR: RPR Ser Ql: NONREACTIVE

## 2018-11-12 MED ORDER — WITCH HAZEL-GLYCERIN EX PADS
1.0000 "application " | MEDICATED_PAD | CUTANEOUS | Status: DC | PRN
  Administered 2018-11-12: 1 via TOPICAL

## 2018-11-12 MED ORDER — TETANUS-DIPHTH-ACELL PERTUSSIS 5-2.5-18.5 LF-MCG/0.5 IM SUSP: 0.5000 mL | Freq: Once | INTRAMUSCULAR | Status: DC

## 2018-11-12 MED ORDER — SIMETHICONE 80 MG PO CHEW: 80.0000 mg | CHEWABLE_TABLET | ORAL | Status: DC | PRN

## 2018-11-12 MED ORDER — COCONUT OIL OIL
1.0000 "application " | TOPICAL_OIL | Status: DC | PRN
  Administered 2018-11-13: 1 via TOPICAL

## 2018-11-12 MED ORDER — BENZOCAINE-MENTHOL 20-0.5 % EX AERO
1.0000 "application " | INHALATION_SPRAY | CUTANEOUS | Status: DC | PRN
  Administered 2018-11-12: 1 via TOPICAL
  Filled 2018-11-12: qty 56

## 2018-11-12 MED ORDER — ONDANSETRON HCL 4 MG/2ML IJ SOLN: 4.0000 mg | INTRAMUSCULAR | Status: DC | PRN

## 2018-11-12 MED ORDER — DIBUCAINE (PERIANAL) 1 % EX OINT: 1.0000 "application " | TOPICAL_OINTMENT | CUTANEOUS | Status: DC | PRN

## 2018-11-12 MED ORDER — ONDANSETRON HCL 4 MG PO TABS: 4.0000 mg | ORAL_TABLET | ORAL | Status: DC | PRN

## 2018-11-12 MED ORDER — METHYLERGONOVINE MALEATE 0.2 MG PO TABS: 0.2000 mg | ORAL_TABLET | ORAL | Status: DC | PRN

## 2018-11-12 MED ORDER — OXYCODONE-ACETAMINOPHEN 5-325 MG PO TABS: 2.0000 | ORAL_TABLET | ORAL | Status: DC | PRN

## 2018-11-12 MED ORDER — DIPHENHYDRAMINE HCL 25 MG PO CAPS: 25.0000 mg | ORAL_CAPSULE | Freq: Four times a day (QID) | ORAL | Status: DC | PRN

## 2018-11-12 MED ORDER — METHYLERGONOVINE MALEATE 0.2 MG/ML IJ SOLN: 0.2000 mg | INTRAMUSCULAR | Status: DC | PRN

## 2018-11-12 MED ORDER — ZOLPIDEM TARTRATE 5 MG PO TABS: 5.0000 mg | ORAL_TABLET | Freq: Every evening | ORAL | Status: DC | PRN

## 2018-11-12 MED ORDER — SENNOSIDES-DOCUSATE SODIUM 8.6-50 MG PO TABS
2.0000 | ORAL_TABLET | ORAL | Status: DC
  Administered 2018-11-13: 2 via ORAL
  Filled 2018-11-12: qty 2

## 2018-11-12 MED ORDER — PRENATAL MULTIVITAMIN CH
1.0000 | ORAL_TABLET | Freq: Every day | ORAL | Status: DC
  Administered 2018-11-12 – 2018-11-13 (×2): 1 via ORAL
  Filled 2018-11-12 (×2): qty 1

## 2018-11-12 MED ORDER — OXYCODONE-ACETAMINOPHEN 5-325 MG PO TABS: 1.0000 | ORAL_TABLET | ORAL | Status: DC | PRN

## 2018-11-12 MED ORDER — ACETAMINOPHEN 325 MG PO TABS: 650.0000 mg | ORAL_TABLET | ORAL | Status: DC | PRN

## 2018-11-12 NOTE — Clinical Social Work Maternal (Signed)
CLINICAL SOCIAL WORK MATERNAL/CHILD NOTE  Patient Details  Name: Stacey Holmes MRN: [id number] Date of Birth: 11/11/2020  Date:  11/12/2018  Clinical Social Worker Initiating Note:  Saretta Dahlem, LCSWA Date/Time: Initiated:  11/12/18/1005     Child's Name:  Stacey Holmes   Biological Parents:  Mother, Father   Need for Interpreter:  None   Reason for Referral:  Behavioral Health Concerns   Address:  [address] [place] [address]    Phone number:  [phone] (home)     Additional phone number: none   Household Members/Support Persons (HM/SP):   Household Member/Support Person 2   HM/SP Name Relationship DOB or Age  HM/SP -1   Stacey Holmes (FOB) FOB   06/17/1979  HM/SP -2 Stacey Holmes (MOB) MOB   08/21/1973  HM/SP -3        HM/SP -4        HM/SP -5        HM/SP -6        HM/SP -7        HM/SP -8          Natural Supports (not living in the home):      Professional Supports:   none reported   Employment: Full-time   Type of Work: [employer]   Education:  College graduate   Homebound arranged:  n/a  Financial Resources:    none reported   Other Resources:    none reported   Cultural/Religious Considerations Which May Impact Care:  none reported   Strengths:  Ability to meet basic needs , Compliance with medical plan , Home prepared for child    Psychotropic Medications:       None reported   Pediatrician:       Pediatrician List:   [place]    [place]    [place]    [place]    [place]    [place]      Pediatrician Fax Number:    Risk Factors/Current Problems:  None   Cognitive State:  Able to Concentrate , Alert , Insightful    Mood/Affect:  Calm , Comfortable , Relaxed    CSW Assessment: CSW consulted as MOB has a history of anxiety depression, and Bipolar. CSW met with MOB at bedside to discuss further needs.  CSW congratulated MOB on the birth of infant once entering the  room. CSW made aware that gentleman at bedside was FOB.CSW advised MOB of the HIPPA policy and MOB requested that FOB stay in room.    CSW advised MOB of the reason for the visit. MOB reports that she was diagnosed with anxiety, depression, and Bipolar 10 years ago. MOB reports that she was on meds up until a year ago and hasn't been on any medication since. MOB expressed that she is also not in therapy and currently has no desires to be in any therapy at this time. CSW understanding of this. MOB reports that she was fine throughout her pregnancy and has no concerns at this time.   CSW was advised by MOB that she has support from her spouse as well as family. MOB expressed that she doesn't have any barriers to transportation and that she has all needed items to care for infant. MOB currently denies SI or HI and reports feelings of being okay.   CSW provided MOB with SIDS as well as PPD. CSW also provided MOB with PPD Checklist for further monitoring of PPD symptoms and sign.     CSW Plan/Description:  No Further Intervention Required/No Barriers to Discharge, Sudden Infant Death Syndrome (SIDS) Education    Jasani Dolney S Hillard Goodwine, LCSWA 11/12/2018, 10:50 AM  

## 2018-11-12 NOTE — Lactation Note (Signed)
This note was copied from a baby's chart. Lactation Consultation Note Baby 78 hrs old. Hasn't BF. Been very sleepy. Mom has inverted nipples. Breast heavy. Hand pump everts outline of nipple. Gave mom shells to wear this am. Fitted mom K/#08 NS taught application. Mom demonstrated.  Shells given, strongly encouraged to wear this am between feedings. DEBP kit placed in room. Discussed w/mom pumping q3 hrs for stimulation. Hand expression w/dot of colostrum to tip of nipple. Newborn feeding habits, STS, I&O, breast massage, positioning, support, supply and demand discussed. Baby attempted to feed in football position. Baby wouldn't open mouth and has no interest in feeding.  Encouraged mom to hold baby STS. FOB had been holding baby STS. Encouraged mom to try to BF if baby hasn't cued in 3 hrs. Lactation brochure given.  Patient Name: Stacey Holmes [id number] Date: 11/12/2018 Reason for consult: Initial assessment;1st time breastfeeding;Term   Maternal Data Has patient been taught Hand Expression?: Yes Does the patient have breastfeeding experience prior to this delivery?: No  Feeding    LATCH Score Latch: Too sleepy or reluctant, no latch achieved, no sucking elicited.  Audible Swallowing: None  Type of Nipple: Inverted  Comfort (Breast/Nipple): Filling, red/small blisters or bruises, mild/mod discomfort(breast heavy)  Hold (Positioning): Full assist, staff holds infant at breast  LATCH Score: 1  Interventions Interventions: Breast feeding basics reviewed;Adjust position;DEBP;Assisted with latch;Support pillows;Skin to skin;Position options;Breast massage;Hand express;Pre-pump if needed;Shells;Breast compression;Hand pump  Lactation Tools Discussed/Used Tools: Shells;Pump;Nipple Shields Nipple shield size: 24 Shell Type: Inverted Breast pump type: Double-Electric Breast Pump;Manual WIC Program: No Pump Review: (RN to set up)   Consult Status Consult Status:  Follow-up Date: 11/12/18 Follow-up type: In-patient    Theodoro Kalata 11/12/2018, 7:16 AM

## 2018-11-12 NOTE — Progress Notes (Signed)
Post Partum Day 1 Subjective: no complaints, up ad lib, voiding and tolerating PO  Objective: Blood pressure 117/64, pulse 72, temperature 98.2 F (36.8 C), temperature source Oral, resp. rate 16, height 5\' 10"  (1.778 m), weight 80.3 kg, last menstrual period 02/10/2018, SpO2 100 %, unknown if currently breastfeeding.  Physical Exam:  General: alert, cooperative and appears stated age 43: appropriate Uterine Fundus: firm Incision: healing well, no significant drainage DVT Evaluation: No evidence of DVT seen on physical exam.  Recent Labs    11/11/18 2223 11/12/18 0543  HGB 12.6 11.3*  HCT 37.1 33.7*    Assessment/Plan: Stable PPD 1 Manual placenta removal Plan for discharge tomorrow and Breastfeeding   LOS: 1 day   Stacey Holmes J 11/12/2018, 8:25 AM

## 2018-11-12 NOTE — Anesthesia Postprocedure Evaluation (Signed)
Anesthesia Post Note  Patient: Stacey Holmes  Procedure(s) Performed: AN AD [place]     Patient location during evaluation: Mother Baby Anesthesia Type: Epidural Level of consciousness: awake and alert Pain management: pain level controlled Vital Signs Assessment: post-procedure vital signs reviewed and stable Respiratory status: spontaneous breathing, nonlabored ventilation and respiratory function stable Cardiovascular status: stable Postop Assessment: no headache, no backache, epidural receding, no apparent nausea or vomiting, patient able to bend at knees, adequate PO intake and able to ambulate Anesthetic complications: no    Last Vitals:  Vitals:   11/12/18 0059 11/12/18 0500  BP: 129/64 117/64  Pulse: 74 72  Resp: 16 16  Temp: 36.9 C 36.8 C  SpO2: 99% 100%    Last Pain:  Vitals:   11/12/18 0500  TempSrc: Oral  PainSc: 3    Pain Goal:                   [employer]

## 2018-11-13 MED ORDER — IBUPROFEN 600 MG PO TABS: 600.0000 mg | ORAL_TABLET | Freq: Four times a day (QID) | ORAL | 0 refills | Status: DC

## 2018-11-13 NOTE — Discharge Summary (Signed)
Obstetric Discharge Summary Reason for Admission: onset of labor Prenatal Procedures: NST, ultrasound for AMA>40 Intrapartum Procedures: epidural Postpartum Procedures: none Complications-Operative and Postpartum: 2nd degree perineal laceration Hemoglobin  Date Value Ref Range Status  11/12/2018 11.3 (L) 12.0 - 15.0 g/dL Final   HCT  Date Value Ref Range Status  11/12/2018 33.7 (L) 36.0 - 46.0 % Final    Physical Exam:  General: alert, cooperative and no distress Lochia: appropriate Uterine Fundus: firm Incision: healing well DVT Evaluation: No evidence of DVT seen on physical exam.  Discharge Diagnoses: Term Pregnancy-delivered  Discharge Information: Date: 11/13/2018 Activity: pelvic rest Diet: routine Medications: PNV, Ibuprofen and claritin Condition: stable Instructions: refer to practice specific booklet Discharge to: home Follow-up Information    Brien Few, MD. Schedule an appointment as soon as possible for a visit in 6 week(s).   Specialty: Obstetrics and Gynecology Contact information: [place] [phone]           Newborn Data: Live born female  Birth Weight: 6 lb 8.6 oz (2965 g) APGAR: 71, 9  Newborn Delivery   Birth date/time: 11/11/2018 21:48:00 Delivery type: Vaginal, Spontaneous      Home with mother.  Artelia Laroche 11/13/2018, 9:29 AM

## 2018-11-13 NOTE — Lactation Note (Signed)
This note was copied from a baby's chart. Lactation Consultation Note:  Infant is 74 hours old and is at 6% weight loss.  When I arrived in the room, mother was holding infant in football  hold. Mother has on a #24 NS due to inverted nipples. Father is pre-filling the NS with formula. Infant is on and off. Father reports that infant had taken 10 ml.  Observed that the nipple shield was full of formula and infant was not suckling.   Assist mother with better placement of the nipple shield. Repositioned infant in football hold with STS.  Infant showing little to no feeding cues.   Infant was finger fed by LC. And given another 2 ml . Infant not suckling and very sleepy.  Observed infant with yellow tone.  Mother assist with hand expression and observed large drops of colostrum. Mother advised to post pump for 15 mins with DEBP after each feeding.   Mother a little teary and advised to nap after pumping.   Suggested that parents use supplemental guidelines and then offer infant as much as she will take. Discussed paced bottle feeding . Encouraged mother to continue to feed infant at least 8-12 or more times in 24 hours.   Bili with be drawn at 4 p,m. Per staff nurse.   Mother to page for assistance with next feeding.   LC did discuss importance of infant getting enough calories. Continue to cue base feed and allow for cluster feeding.   Discussed treatment and prevention of engorgement.  Mother to follow up with [facility] services weekly while using NS. Mother is aware of available [place] services and 7 day a week for breastfeeding questions or concerns  Patient Name: Stacey Holmes [id number] Date: 11/13/2018 Reason for consult: Follow-up assessment   Maternal Data    Feeding Feeding Type: Breast Fed  LATCH Score Latch: Repeated attempts needed to sustain latch, nipple held in mouth throughout feeding, stimulation needed to elicit sucking reflex.  Audible Swallowing: A few with  stimulation  Type of Nipple: Inverted  Comfort (Breast/Nipple): Filling, red/small blisters or bruises, mild/mod discomfort(nipples pink)  Hold (Positioning): Assistance needed to correctly position infant at breast and maintain latch.  LATCH Score: 4  Interventions Interventions: Assisted with latch;Skin to skin;Breast massage;Hand express;Breast compression;Adjust position;Support pillows;Position options;Expressed milk;Shells;Hand pump;DEBP  Lactation Tools Discussed/Used Tools: Nipple Shields Nipple shield size: 24 Shell Type: Inverted Breast pump type: Double-Electric Breast Pump;Manual   Consult Status Consult Status: Follow-up Date: 11/13/18 Follow-up type: In-patient    Jess Barters [facility] 11/13/2018, 11:20 AM

## 2018-11-13 NOTE — Progress Notes (Signed)
PPD 2 SVD with 2nd degree repair  Subjective:   reports feeling good - ready to go home tolerating po intake without nausea or vomiting vaginal bleeding reported as light pain controlled with Motrin up ad lib /  voiding QS without urinary concerns  Newborn Breast / female  Objective:   VS: BP 119/73 (BP Location: Right Arm)   Pulse 64   Temp 98.2 F (36.8 C) (Oral)   Resp 16   Ht 5\' 10"  (1.778 m)   Wt 80.3 kg   LMP 02/10/2018   SpO2 100%   Breastfeeding Unknown   BMI 25.40 kg/m   LABS:  Recent Labs    11/11/18 2223 11/12/18 0543  WBC 11.4* 11.8*  HGB 12.6 11.3*  PLT 138* 130*   Blood type: A pos Rubella: Immune (11/26 0000)        Physical Exam: alert and oriented X3 without any distress or pai abdomen soft, non-tender, non-distended  uterine fundus firm, non-tender, U-1 Perineum mild edema lochia light extremities: no edema, no calf pain or tenderness   Assessment / Plan:  PPD # 2 SVD with 2nd degree repair  doing well - stable status routine post partum orders DC home - WOB booklet - instructions reviewed  Artelia Laroche CNM, MSN, [facility] 11/13/2018, 7:47 AM

## 2019-08-05 ENCOUNTER — Ambulatory Visit: Payer: 59 | Attending: Internal Medicine

## 2019-08-05 DIAGNOSIS — Z23 Encounter for immunization: Secondary | ICD-10-CM

## 2019-08-05 NOTE — Progress Notes (Signed)
   Covid-19 Vaccination Clinic  Name:  Jeilin Mebane    MRN: [id number] DOB: 02/15/1976  08/05/2019  Ms. Ventrone was observed post Covid-19 immunization for 15 minutes without incident. She was provided with Vaccine Information Sheet and instruction to access the V-Safe system.   Ms. Flournoy was instructed to call 911 with any severe reactions post vaccine: Marland Kitchen Difficulty breathing  . Swelling of face and throat  . A fast heartbeat  . A bad rash all over body  . Dizziness and weakness   Immunizations Administered    Name Date Dose VIS Date Route   Pfizer COVID-19 Vaccine 08/05/2019  4:01 PM 0.3 mL 04/29/2019 Intramuscular   Manufacturer: [place]   Lot: [id number]   [place]: [id number]

## 2019-08-31 ENCOUNTER — Ambulatory Visit: Payer: 59 | Attending: Internal Medicine

## 2019-08-31 DIAGNOSIS — Z23 Encounter for immunization: Secondary | ICD-10-CM

## 2019-08-31 NOTE — Progress Notes (Signed)
   Covid-19 Vaccination Clinic  Name:  Stacey Holmes    MRN: [id number] DOB: [birth year]  08/31/2019  [name] was observed post Covid-19 immunization for 15 minutes without incident. She was provided with Vaccine Information Sheet and instruction to access the V-Safe system.   [name] was instructed to call 911 with any severe reactions post vaccine: Marland Kitchen Difficulty breathing  . Swelling of face and throat  . A fast heartbeat  . A bad rash all over body  . Dizziness and weakness   Immunizations Administered    Name Date Dose VIS Date Route   Pfizer COVID-19 Vaccine 08/31/2019  9:21 AM 0.3 mL 04/29/2019 Intramuscular   Manufacturer: [place]   Lot: [id number]   [place]: [id number]

## 2020-03-09 ENCOUNTER — Encounter (HOSPITAL_COMMUNITY): Payer: Self-pay | Admitting: Orthopedic Surgery

## 2020-03-09 ENCOUNTER — Emergency Department (HOSPITAL_COMMUNITY): Payer: 59

## 2020-03-09 ENCOUNTER — Emergency Department (HOSPITAL_COMMUNITY)
Admission: EM | Admit: 2020-03-09 | Discharge: 2020-03-09 | Disposition: A | Discharge status: Home / Self Care | Payer: 59 | Attending: Emergency Medicine | Admitting: Emergency Medicine

## 2020-03-09 ENCOUNTER — Encounter (HOSPITAL_COMMUNITY): Payer: Self-pay

## 2020-03-09 ENCOUNTER — Other Ambulatory Visit: Payer: Self-pay

## 2020-03-09 DIAGNOSIS — W010XXA Fall on same level from slipping, tripping and stumbling without subsequent striking against object, initial encounter: Secondary | ICD-10-CM | POA: Diagnosis not present

## 2020-03-09 DIAGNOSIS — S42411A Displaced simple supracondylar fracture without intercondylar fracture of right humerus, initial encounter for closed fracture: Secondary | ICD-10-CM | POA: Insufficient documentation

## 2020-03-09 DIAGNOSIS — W19XXXA Unspecified fall, initial encounter: Secondary | ICD-10-CM

## 2020-03-09 DIAGNOSIS — S59901A Unspecified injury of right elbow, initial encounter: Secondary | ICD-10-CM | POA: Diagnosis present

## 2020-03-09 DIAGNOSIS — Z20822 Contact with and (suspected) exposure to covid-19: Secondary | ICD-10-CM | POA: Insufficient documentation

## 2020-03-09 LAB — BASIC METABOLIC PANEL
Anion gap: 14 (ref 5–15)
BUN: 16 mg/dL (ref 6–20)
CO2: 18 mmol/L — ABNORMAL LOW (ref 22–32)
Calcium: 8.8 mg/dL — ABNORMAL LOW (ref 8.9–10.3)
Chloride: 108 mmol/L (ref 98–111)
Creatinine, Ser: 0.75 mg/dL (ref 0.44–1.00)
GFR, Estimated: >60 mL/min (ref >60)
Glucose, Bld: 111 mg/dL — ABNORMAL HIGH (ref 70–99)
Potassium: 4.2 mmol/L (ref 3.5–5.1)
Sodium: 140 mmol/L (ref 135–145)

## 2020-03-09 LAB — CBC WITH DIFFERENTIAL/PLATELET
Abs Immature Granulocytes: 0.06 10*3/uL (ref 0.00–0.07)
Basophils Absolute: 0 10*3/uL (ref 0.0–0.1)
Basophils Relative: 0 %
Eosinophils Absolute: 0 10*3/uL (ref 0.0–0.5)
Eosinophils Relative: 0 %
HCT: 39.5 % (ref 36.0–46.0)
Hemoglobin: 12.9 g/dL (ref 12.0–15.0)
Immature Granulocytes: 0 %
Lymphocytes Relative: 5 %
Lymphs Abs: 0.8 10*3/uL (ref 0.7–4.0)
MCH: 32.3 pg (ref 26.0–34.0)
MCHC: 32.7 g/dL (ref 30.0–36.0)
MCV: 98.8 fL (ref 80.0–100.0)
Monocytes Absolute: 0.6 10*3/uL (ref 0.1–1.0)
Monocytes Relative: 4 %
Neutro Abs: 14.9 10*3/uL — ABNORMAL HIGH (ref 1.7–7.7)
Neutrophils Relative %: 91 %
Platelets: 197 10*3/uL (ref 150–400)
RBC: 4 MIL/uL (ref 3.87–5.11)
RDW: 12.6 % (ref 11.5–15.5)
WBC: 16.4 10*3/uL — ABNORMAL HIGH (ref 4.0–10.5)
nRBC: 0 % (ref 0.0–0.2)

## 2020-03-09 MED ORDER — HYDROMORPHONE HCL 1 MG/ML IJ SOLN
0.5000 mg | Freq: Once | INTRAMUSCULAR | Status: AC
  Administered 2020-03-09: 0.5 mg via INTRAVENOUS
  Filled 2020-03-09: qty 1

## 2020-03-09 MED ORDER — FENTANYL CITRATE (PF) 100 MCG/2ML IJ SOLN
25.0000 ug | Freq: Once | INTRAMUSCULAR | Status: AC
  Administered 2020-03-09: 25 ug via INTRAVENOUS
  Filled 2020-03-09: qty 2

## 2020-03-09 MED ORDER — OXYCODONE-ACETAMINOPHEN 5-325 MG PO TABS
1.0000 | ORAL_TABLET | Freq: Four times a day (QID) | ORAL | 0 refills | Status: DC | PRN
Start: 2020-03-09 — End: 2020-03-12

## 2020-03-09 MED ORDER — SODIUM CHLORIDE 0.9 % IV BOLUS
1000.0000 mL | Freq: Once | INTRAVENOUS | Status: AC
  Administered 2020-03-09: 1000 mL via INTRAVENOUS

## 2020-03-09 NOTE — Discharge Instructions (Signed)
Take ibuprofen 3 times a day with meals.  Do not take other anti-inflammatories at the same time (Advil, Motrin, naproxen, Aleve). You may supplement with Tylenol if you need further pain control. Use percocet as needed for severe or breakthrough pain. Have caution, this may make you tired or groggy. Do not drive or operate heavy machinery while taking this medicine.  Use ice packs for pain and swelling. Try to keep your elbow elevated.   Dr. Carlean Jews office will reach out to you for further information regarding Monday.  Quarantine at home over the weekend so that you do not potentially get exposed to covid prior to surgery.  Return to the ER with severe/worsening pain, numbness/color change of your hand, or with any new, worsening, or concerning symptoms.

## 2020-03-09 NOTE — Progress Notes (Signed)
Spoke with pt for pre-op call. Pt states she had palpitations several years ago and saw a cardiologist one time (can't remember who she saw). States she was told she didn't need to follow up.   Covid test done today while she was in the ED with her injured elbow. She states she has been in quarantine since the test was done and will stay in quarantine until she comes to the hospital on Monday.

## 2020-03-09 NOTE — ED Triage Notes (Signed)
Patient states she slipped in the bathroom today and extended her right arm to catch herself. Patient c/o right elbow pain. paitent denies hitting her head or having LOC.

## 2020-03-09 NOTE — ED Provider Notes (Signed)
[facility] DEPT Provider Note   CSN: [id number] Arrival date & time: 03/09/20  0756     History Chief Complaint  Patient presents with   Fall   Elbow Pain    Stacey Holmes is a 44 y.o. female presenting for evaluation of R elbow pain after a fall.   States just prior to arrival she was in the bathroom when she slipped on some water and fell, reaching out with her right arm.  She landed on an outstretched arm and had acute onset pain in her right elbow.  Since then, she has had severe pain in her elbow, limited motion.  She denies numbness or tingling.  Denies hitting her head or loss of consciousness.  She did fall onto her right side after landing.  She denies injury or pain elsewhere.  Has ambulated since.  She has no other medical problems, takes no medications daily.   HPI     Past Medical History:  Diagnosis Date   Anemia    Anxiety    Bipolar depression ([place])    Bipolar disorder ([place])    Depression    Heart palpitations    HPV in female    IBS (irritable bowel syndrome)    Vaginal Pap smear, abnormal     Patient Active Problem List   Diagnosis Date Noted   SVD (spontaneous vaginal delivery) 11/12/2018   Postpartum care following vaginal delivery (6/25) 11/12/2018   Normal labor 11/11/2018    Past Surgical History:  Procedure Laterality Date   CARPAL TUNNEL RELEASE  01/22/2012   Procedure: CARPAL TUNNEL RELEASE;  Surgeon: Tennis Must, MD;  Location: [facility];  Service: Orthopedics;  Laterality: Right;  right carpal tunnel release     OB History    Gravida  2   Para  1   Term  1   Preterm      AB  1   Living  1     SAB  1   TAB      Ectopic      Multiple  0   Live Births  1           Family History  Problem Relation Age of Onset   Hypertension Mother     Social History   Tobacco Use   Smoking status: Never Smoker   Smokeless tobacco: Never Used  Vaping Use     Vaping Use: Never used  Substance Use Topics   Alcohol use: Not Currently    Comment: social   Drug use: No    Home Medications Prior to Admission medications   Medication Sig Start Date End Date Taking? Authorizing Provider  acetaminophen (TYLENOL) 500 MG tablet Take 1,000 mg by mouth 3 (three) times daily as needed for moderate pain or headache.   Yes [provider]  calcium carbonate (OS-CAL) 1250 (500 Ca) MG chewable tablet Chew 1 tablet by mouth daily.   Yes [provider]  cholecalciferol (VITAMIN D3) 25 MCG (1000 UNIT) tablet Take 1,000 Units by mouth daily.   Yes [provider]  escitalopram (LEXAPRO) 20 MG tablet Take 20 mg by mouth daily. 03/06/20  Yes [provider]  ferrous sulfate 325 (65 FE) MG EC tablet Take 325 mg by mouth daily with breakfast.   Yes [provider]  oxyCODONE-acetaminophen (PERCOCET/ROXICET) 5-325 MG tablet Take 1 tablet by mouth every 6 (six) hours as needed for severe pain. 03/09/20   Gracen Southwell, PA-C  Allergies    Dairy aid [lactase], Gluten meal, Penicillins, and Pork-derived products  Review of Systems   Review of Systems  Musculoskeletal: Positive for arthralgias.  Neurological: Negative for numbness.  All other systems reviewed and are negative.   Physical Exam Updated Vital Signs BP 104/72 (BP Location: Left Arm)    Pulse 76    Temp 98.2 F (36.8 C) (Oral)    Resp 16    Ht 5\' 10"  (1.778 m)    Wt 70.3 kg    LMP 02/17/2020    SpO2 100%    BMI 22.24 kg/m   Physical Exam Vitals and nursing note reviewed.  Constitutional:      General: She is not in acute distress.    Appearance: She is well-developed.     Comments: Appears uncomfortable due to pain.   HENT:     Head: Normocephalic and atraumatic.  Eyes:     Extraocular Movements: Extraocular movements intact.     Conjunctiva/sclera: Conjunctivae normal.     Pupils: Pupils are equal, round, and reactive to light.  Neck:      Comments: No ttp of midline c-spine Cardiovascular:     Rate and Rhythm: Normal rate and regular rhythm.     Pulses: Normal pulses.  Pulmonary:     Effort: Pulmonary effort is normal. No respiratory distress.     Breath sounds: Normal breath sounds. No wheezing.  Abdominal:     General: There is no distension.     Palpations: Abdomen is soft. There is no mass.     Tenderness: There is no abdominal tenderness. There is no guarding or rebound.  Musculoskeletal:        General: Tenderness present.     Cervical back: Normal range of motion and neck supple.     Comments: Swelling and tenderness of the right elbow.  Radial pulse 2+ bilaterally.  Good distal sensation and cap refill. No tenderness palpation of fingers, hand, wrist, shoulder of the right upper extremity.  Skin:    General: Skin is warm and dry.     Capillary Refill: Capillary refill takes less than 2 seconds.  Neurological:     Mental Status: She is alert and oriented to person, place, and time.     ED Results / Procedures / Treatments   Labs (all labs ordered are listed, but only abnormal results are displayed) Labs Reviewed  CBC WITH DIFFERENTIAL/PLATELET - Abnormal; Notable for the following components:      Result Value   WBC 16.4 (*)    Neutro Abs 14.9 (*)    All other components within normal limits  BASIC METABOLIC PANEL - Abnormal; Notable for the following components:   CO2 18 (*)    Glucose, Bld 111 (*)    Calcium 8.8 (*)    All other components within normal limits  RESPIRATORY PANEL BY RT PCR (FLU A&B, COVID)    EKG None  Radiology DG Elbow Complete Right  Result Date: 03/09/2020 CLINICAL DATA:  Right elbow pain after fall. EXAM: RIGHT ELBOW - COMPLETE 3+ VIEW COMPARISON:  None. FINDINGS: Severely displaced and comminuted fracture is seen involving the distal right humerus. No definite fracture or dislocation is seen involving the right radius or ulna. IMPRESSION: Severely displaced and  comminuted distal right humeral fracture. Electronically Signed   By: Marijo Conception M.D.   On: 03/09/2020 08:36    Procedures Procedures (including critical care time)  Medications Ordered in ED Medications  fentaNYL (SUBLIMAZE) injection  25 mcg (25 mcg Intravenous Given 03/09/20 0839)  sodium chloride 0.9 % bolus 1,000 mL (0 mLs Intravenous Stopped 03/09/20 0924)  HYDROmorphone (DILAUDID) injection 0.5 mg (0.5 mg Intravenous Given 03/09/20 0936)  HYDROmorphone (DILAUDID) injection 0.5 mg (0.5 mg Intravenous Given 03/09/20 1103)    ED Course  I have reviewed the triage vital signs and the nursing notes.  Pertinent labs & imaging results that were available during my care of the patient were reviewed by me and considered in my medical decision making (see chart for details).    MDM Rules/Calculators/A&P                          Patient resenting for evaluation of right elbow pain after a fall.  On exam, patient is neurovascularly intact.  Limited range of motion due to pain.  Will obtain x-rays, treat pain, and reassess.  X-ray viewed interpreted by me, consistent with supracondylar fracture.  Will consult with orthopedics.  Discussed with Dr. Lorin Mercy of orthopedics, who will consult with trauma orthopedic doctor for further plan.  Per Dr. Lorin Mercy, Dr. Marcelino Scot from orthopedics recommends posterior splint with lots of padding, sling, discharged with plan for outpatient surgery on Monday, in 3 days.  Requests Covid test and that patient quarantine at home between now and surgery.  On reassessment after pain medication, patient reports pain is controlled.  Patient neurovascularly intact after splint placement.  Discussed pain management and importance of follow-up with orthopedics.  At this time, patient appears safe for discharge.  Return precautions given.  Patient states she understands and agrees to plan.  Final Clinical Impression(s) / ED Diagnoses Final diagnoses:  Closed  supracondylar fracture of right humerus, initial encounter  Fall, initial encounter    Rx / DC Orders ED Discharge Orders         Ordered    oxyCODONE-acetaminophen (PERCOCET/ROXICET) 5-325 MG tablet  Every 6 hours PRN        03/09/20 1101           Cacie Gaskins, PA-C 03/09/20 1149    Dorie Rank, MD 03/12/20 [phone]

## 2020-03-12 ENCOUNTER — Ambulatory Visit (HOSPITAL_COMMUNITY)
Admission: RE | Admit: 2020-03-12 | Discharge: 2020-03-12 | Disposition: A | Discharge status: Home / Self Care | Payer: 59 | Attending: Orthopedic Surgery | Admitting: Orthopedic Surgery

## 2020-03-12 ENCOUNTER — Ambulatory Visit (HOSPITAL_COMMUNITY): Payer: 59

## 2020-03-12 ENCOUNTER — Encounter (HOSPITAL_COMMUNITY)
Admission: RE | Disposition: A | Discharge status: Home / Self Care | Payer: Self-pay | Source: Home / Self Care | Attending: Orthopedic Surgery

## 2020-03-12 ENCOUNTER — Encounter (HOSPITAL_COMMUNITY): Payer: Self-pay | Admitting: Orthopedic Surgery

## 2020-03-12 ENCOUNTER — Ambulatory Visit (HOSPITAL_COMMUNITY): Payer: 59 | Admitting: Vascular Surgery

## 2020-03-12 DIAGNOSIS — S42491A Other displaced fracture of lower end of right humerus, initial encounter for closed fracture: Secondary | ICD-10-CM | POA: Insufficient documentation

## 2020-03-12 DIAGNOSIS — W1830XA Fall on same level, unspecified, initial encounter: Secondary | ICD-10-CM | POA: Insufficient documentation

## 2020-03-12 DIAGNOSIS — Z419 Encounter for procedure for purposes other than remedying health state, unspecified: Secondary | ICD-10-CM

## 2020-03-12 DIAGNOSIS — Z79899 Other long term (current) drug therapy: Secondary | ICD-10-CM | POA: Insufficient documentation

## 2020-03-12 DIAGNOSIS — F419 Anxiety disorder, unspecified: Secondary | ICD-10-CM | POA: Diagnosis not present

## 2020-03-12 DIAGNOSIS — T148XXA Other injury of unspecified body region, initial encounter: Secondary | ICD-10-CM

## 2020-03-12 DIAGNOSIS — S42401A Unspecified fracture of lower end of right humerus, initial encounter for closed fracture: Secondary | ICD-10-CM | POA: Diagnosis present

## 2020-03-12 HISTORY — PX: ORIF HUMERUS FRACTURE: SHX2126

## 2020-03-12 LAB — RESPIRATORY PANEL BY RT PCR (FLU A&B, COVID)
Influenza A by PCR: NEGATIVE
Influenza B by PCR: NEGATIVE
SARS Coronavirus 2 by RT PCR: NEGATIVE

## 2020-03-12 LAB — POCT PREGNANCY, URINE: Preg Test, Ur: NEGATIVE

## 2020-03-12 SURGERY — OPEN REDUCTION INTERNAL FIXATION (ORIF) DISTAL HUMERUS FRACTURE
Anesthesia: General | Laterality: Right

## 2020-03-12 MED ORDER — METHOCARBAMOL 500 MG PO TABS
500.0000 mg | ORAL_TABLET | Freq: Four times a day (QID) | ORAL | 0 refills | Status: DC | PRN
Start: 2020-03-12 — End: 2022-07-01

## 2020-03-12 MED ORDER — MIDAZOLAM HCL 5 MG/5ML IJ SOLN
INTRAMUSCULAR | Status: DC | PRN
  Administered 2020-03-12: 2 mg via INTRAVENOUS

## 2020-03-12 MED ORDER — OXYCODONE HCL 5 MG/5ML PO SOLN: 5.0000 mg | Freq: Once | ORAL | Status: DC | PRN

## 2020-03-12 MED ORDER — DEXAMETHASONE SODIUM PHOSPHATE 10 MG/ML IJ SOLN
INTRAMUSCULAR | Status: DC | PRN
  Administered 2020-03-12: 5 mg

## 2020-03-12 MED ORDER — ONDANSETRON HCL 4 MG/2ML IJ SOLN
INTRAMUSCULAR | Status: DC | PRN
  Administered 2020-03-12: 4 mg via INTRAVENOUS

## 2020-03-12 MED ORDER — LACTATED RINGERS IV SOLN: INTRAVENOUS | Status: DC

## 2020-03-12 MED ORDER — ORAL CARE MOUTH RINSE: 15.0000 mL | Freq: Once | OROMUCOSAL | Status: AC

## 2020-03-12 MED ORDER — PHENYLEPHRINE 40 MCG/ML (10ML) SYRINGE FOR IV PUSH (FOR BLOOD PRESSURE SUPPORT)
PREFILLED_SYRINGE | INTRAVENOUS | Status: AC
  Filled 2020-03-12: qty 10

## 2020-03-12 MED ORDER — SUCCINYLCHOLINE CHLORIDE 200 MG/10ML IV SOSY
PREFILLED_SYRINGE | INTRAVENOUS | Status: AC
  Filled 2020-03-12: qty 10

## 2020-03-12 MED ORDER — FENTANYL CITRATE (PF) 100 MCG/2ML IJ SOLN
INTRAMUSCULAR | Status: DC | PRN
  Administered 2020-03-12: 25 ug via INTRAVENOUS
  Administered 2020-03-12: 100 ug via INTRAVENOUS
  Administered 2020-03-12: 50 ug via INTRAVENOUS

## 2020-03-12 MED ORDER — ROCURONIUM BROMIDE 10 MG/ML (PF) SYRINGE
PREFILLED_SYRINGE | INTRAVENOUS | Status: DC | PRN
  Administered 2020-03-12: 50 mg via INTRAVENOUS

## 2020-03-12 MED ORDER — ONDANSETRON HCL 4 MG/2ML IJ SOLN: 4.0000 mg | Freq: Once | INTRAMUSCULAR | Status: DC | PRN

## 2020-03-12 MED ORDER — EPHEDRINE 5 MG/ML INJ
INTRAVENOUS | Status: AC
  Filled 2020-03-12: qty 10

## 2020-03-12 MED ORDER — 0.9 % SODIUM CHLORIDE (POUR BTL) OPTIME
TOPICAL | Status: DC | PRN
  Administered 2020-03-12: 1000 mL

## 2020-03-12 MED ORDER — MIDAZOLAM HCL 2 MG/2ML IJ SOLN
INTRAMUSCULAR | Status: AC
  Filled 2020-03-12: qty 2

## 2020-03-12 MED ORDER — ACETAMINOPHEN 500 MG PO TABS: 500.0000 mg | ORAL_TABLET | Freq: Three times a day (TID) | ORAL | 0 refills | Status: DC | PRN

## 2020-03-12 MED ORDER — ONDANSETRON HCL 4 MG/2ML IJ SOLN
INTRAMUSCULAR | Status: AC
  Filled 2020-03-12: qty 2

## 2020-03-12 MED ORDER — PHENYLEPHRINE 40 MCG/ML (10ML) SYRINGE FOR IV PUSH (FOR BLOOD PRESSURE SUPPORT)
PREFILLED_SYRINGE | INTRAVENOUS | Status: DC | PRN
  Administered 2020-03-12: 40 ug via INTRAVENOUS

## 2020-03-12 MED ORDER — OXYCODONE-ACETAMINOPHEN 5-325 MG PO TABS: 1.0000 | ORAL_TABLET | Freq: Four times a day (QID) | ORAL | 0 refills | Status: DC | PRN

## 2020-03-12 MED ORDER — CEFAZOLIN SODIUM-DEXTROSE 2-4 GM/100ML-% IV SOLN
INTRAVENOUS | Status: AC
  Filled 2020-03-12: qty 100

## 2020-03-12 MED ORDER — DEXAMETHASONE SODIUM PHOSPHATE 10 MG/ML IJ SOLN
INTRAMUSCULAR | Status: AC
  Filled 2020-03-12: qty 1

## 2020-03-12 MED ORDER — PROPOFOL 10 MG/ML IV BOLUS
INTRAVENOUS | Status: AC
  Filled 2020-03-12: qty 20

## 2020-03-12 MED ORDER — ROPIVACAINE HCL 5 MG/ML IJ SOLN
INTRAMUSCULAR | Status: DC | PRN
  Administered 2020-03-12: 30 mL via PERINEURAL

## 2020-03-12 MED ORDER — OXYCODONE HCL 5 MG PO TABS: 5.0000 mg | ORAL_TABLET | Freq: Once | ORAL | Status: DC | PRN

## 2020-03-12 MED ORDER — ONDANSETRON 4 MG PO TBDP: 4.0000 mg | ORAL_TABLET | Freq: Three times a day (TID) | ORAL | 0 refills | Status: DC | PRN

## 2020-03-12 MED ORDER — DEXAMETHASONE SODIUM PHOSPHATE 10 MG/ML IJ SOLN
INTRAMUSCULAR | Status: DC | PRN
  Administered 2020-03-12: 5 mg via INTRAVENOUS

## 2020-03-12 MED ORDER — VANCOMYCIN HCL 1000 MG IV SOLR
INTRAVENOUS | Status: AC
  Filled 2020-03-12: qty 1000

## 2020-03-12 MED ORDER — FENTANYL CITRATE (PF) 100 MCG/2ML IJ SOLN: 25.0000 ug | INTRAMUSCULAR | Status: DC | PRN

## 2020-03-12 MED ORDER — LIDOCAINE 2% (20 MG/ML) 5 ML SYRINGE
INTRAMUSCULAR | Status: DC | PRN
  Administered 2020-03-12: 60 mg via INTRAVENOUS

## 2020-03-12 MED ORDER — PROPOFOL 10 MG/ML IV BOLUS
INTRAVENOUS | Status: DC | PRN
  Administered 2020-03-12: 150 mg via INTRAVENOUS
  Administered 2020-03-12: 20 mg via INTRAVENOUS
  Administered 2020-03-12: 30 mg via INTRAVENOUS

## 2020-03-12 MED ORDER — LIDOCAINE 2% (20 MG/ML) 5 ML SYRINGE
INTRAMUSCULAR | Status: AC
  Filled 2020-03-12: qty 5

## 2020-03-12 MED ORDER — AMISULPRIDE (ANTIEMETIC) 5 MG/2ML IV SOLN
10.0000 mg | Freq: Once | INTRAVENOUS | Status: AC | PRN
  Administered 2020-03-12: 5 mg via INTRAVENOUS

## 2020-03-12 MED ORDER — CHLORHEXIDINE GLUCONATE 0.12 % MT SOLN
15.0000 mL | Freq: Once | OROMUCOSAL | Status: AC
  Administered 2020-03-12: 15 mL via OROMUCOSAL
  Filled 2020-03-12: qty 15

## 2020-03-12 MED ORDER — ROCURONIUM BROMIDE 10 MG/ML (PF) SYRINGE
PREFILLED_SYRINGE | INTRAVENOUS | Status: AC
  Filled 2020-03-12: qty 10

## 2020-03-12 MED ORDER — FENTANYL CITRATE (PF) 250 MCG/5ML IJ SOLN
INTRAMUSCULAR | Status: AC
  Filled 2020-03-12: qty 5

## 2020-03-12 MED ORDER — AMISULPRIDE (ANTIEMETIC) 5 MG/2ML IV SOLN
INTRAVENOUS | Status: AC
  Filled 2020-03-12: qty 2

## 2020-03-12 MED ORDER — EPHEDRINE SULFATE-NACL 50-0.9 MG/10ML-% IV SOSY
PREFILLED_SYRINGE | INTRAVENOUS | Status: DC | PRN
  Administered 2020-03-12 (×5): 5 mg via INTRAVENOUS

## 2020-03-12 MED ORDER — CEFAZOLIN SODIUM-DEXTROSE 2-4 GM/100ML-% IV SOLN
2.0000 g | Freq: Once | INTRAVENOUS | Status: AC
  Administered 2020-03-12: 2 g via INTRAVENOUS

## 2020-03-12 SURGICAL SUPPLY — 91 items
BIT DRILL 2.0 LNG QUCK RELEASE (BIT) ×1 IMPLANT
BIT DRILL 2.8 QUICK RELEASE (BIT) ×1 IMPLANT
BIT DRILL QUICK RELEASE 3.5MM (BIT) ×1 IMPLANT
BLADE AVERAGE 25X9 (BLADE) IMPLANT
BNDG COHESIVE 4X5 TAN STRL (GAUZE/BANDAGES/DRESSINGS) ×2 IMPLANT
BNDG ELASTIC 3X5.8 VLCR STR LF (GAUZE/BANDAGES/DRESSINGS) ×2 IMPLANT
BNDG ELASTIC 4X5.8 VLCR STR LF (GAUZE/BANDAGES/DRESSINGS) ×2 IMPLANT
BNDG ESMARK 4X9 LF (GAUZE/BANDAGES/DRESSINGS) ×2 IMPLANT
BNDG GAUZE ELAST 4 BULKY (GAUZE/BANDAGES/DRESSINGS) ×4 IMPLANT
BRUSH SCRUB EZ PLAIN DRY (MISCELLANEOUS) ×4 IMPLANT
CORD BIPOLAR FORCEPS 12FT (ELECTRODE) IMPLANT
COVER SURGICAL LIGHT HANDLE (MISCELLANEOUS) ×2 IMPLANT
COVER WAND RF STERILE (DRAPES) ×2 IMPLANT
CUFF TOURN SGL QUICK 18X4 (TOURNIQUET CUFF) ×2 IMPLANT
DRAIN PENROSE 0.25X18 (DRAIN) ×2 IMPLANT
DRAPE C-ARM 42X72 X-RAY (DRAPES) ×2 IMPLANT
DRAPE C-ARMOR (DRAPES) IMPLANT
DRAPE HALF SHEET 40X57 (DRAPES) IMPLANT
DRAPE INCISE IOBAN 66X45 STRL (DRAPES) IMPLANT
DRAPE ORTHO SPLIT 77X108 STRL (DRAPES) ×1
DRAPE SURG ORHT 6 SPLT 77X108 (DRAPES) ×1 IMPLANT
DRAPE U-SHAPE 47X51 STRL (DRAPES) ×4 IMPLANT
DRILL 2.0 LNG QUICK RELEASE (BIT) ×2
DRILL 2.8 QUICK RELEASE (BIT) ×2
DRILL QUICK RELEASE 3.5MM (BIT) ×2
DRSG ADAPTIC 3X8 NADH LF (GAUZE/BANDAGES/DRESSINGS) ×2 IMPLANT
DRSG MEPITEL 4X7.2 (GAUZE/BANDAGES/DRESSINGS) ×2 IMPLANT
DRSG PAD ABDOMINAL 8X10 ST (GAUZE/BANDAGES/DRESSINGS) ×2 IMPLANT
ELECT REM PT RETURN 9FT ADLT (ELECTROSURGICAL) ×2
ELECTRODE REM PT RTRN 9FT ADLT (ELECTROSURGICAL) ×1 IMPLANT
EVACUATOR 1/8 PVC DRAIN (DRAIN) IMPLANT
GAUZE SPONGE 4X4 12PLY STRL (GAUZE/BANDAGES/DRESSINGS) ×2 IMPLANT
GLOVE BIO SURGEON STRL SZ7.5 (GLOVE) ×2 IMPLANT
GLOVE BIOGEL PI IND STRL 7.5 (GLOVE) ×1 IMPLANT
GLOVE BIOGEL PI IND STRL 8 (GLOVE) ×1 IMPLANT
GLOVE BIOGEL PI INDICATOR 7.5 (GLOVE) ×1
GLOVE BIOGEL PI INDICATOR 8 (GLOVE) ×1
GOWN STRL REUS W/ TWL LRG LVL3 (GOWN DISPOSABLE) ×3 IMPLANT
GOWN STRL REUS W/ TWL XL LVL3 (GOWN DISPOSABLE) ×2 IMPLANT
GOWN STRL REUS W/TWL LRG LVL3 (GOWN DISPOSABLE) ×6
GOWN STRL REUS W/TWL XL LVL3 (GOWN DISPOSABLE) ×4
GUIDEWIRE ORTH 6X062XTROC NS (WIRE) ×2 IMPLANT
GUIDEWIRE ORTHO MINI ACTK .045 (WIRE) ×2 IMPLANT
K-WIRE .062 (WIRE) ×2
KIT BASIN OR (CUSTOM PROCEDURE TRAY) ×2 IMPLANT
KIT TURNOVER KIT B (KITS) ×2 IMPLANT
MANIFOLD NEPTUNE II (INSTRUMENTS) IMPLANT
NEEDLE HYPO 25X1 1.5 SAFETY (NEEDLE) IMPLANT
NS IRRIG 1000ML POUR BTL (IV SOLUTION) ×2 IMPLANT
PACK ORTHO EXTREMITY (CUSTOM PROCEDURE TRAY) ×2 IMPLANT
PAD ABD 8X10 STRL (GAUZE/BANDAGES/DRESSINGS) ×4 IMPLANT
PAD ARMBOARD 7.5X6 YLW CONV (MISCELLANEOUS) ×4 IMPLANT
PLATE BONE POST 5H RT ELBOW (Plate) ×2 IMPLANT
PLATE MEDIAL LOCKING 7H 84MM (Plate) ×2 IMPLANT
PLATE TAP FOR 3.5 SCREW (TAP) ×2 IMPLANT
SCREW CORTICAL 3.5X20MM (Screw) ×4 IMPLANT
SCREW HEX LOCK 2.7X14MM (Screw) ×2 IMPLANT
SCREW HEX LOCK 2.7X16MM (Screw) ×2 IMPLANT
SCREW HEX LOCK 3.5X20MM (Screw) ×6 IMPLANT
SCREW HEX NON LOCK 3.5X26MM (Screw) ×6 IMPLANT
SCREW HEX NON LOCK 3.5X36MM (Screw) ×2 IMPLANT
SCREW HEXALOBE LOCK 3.5X50MM (Screw) ×2 IMPLANT
SCREW LOCK 20X2.7X HEXALOBE (Screw) ×1 IMPLANT
SCREW LOCK 22X2.7X HEXALOBE (Screw) ×1 IMPLANT
SCREW LOCK 26X2.7X HEXALOBE (Screw) ×2 IMPLANT
SCREW LOCK 55X3.5X HEXALOBE (Screw) ×1 IMPLANT
SCREW LOCKING 2.7X20MM (Screw) ×2 IMPLANT
SCREW LOCKING 2.7X22MM (Screw) ×1 IMPLANT
SCREW LOCKING 2.7X26MM (Screw) ×2 IMPLANT
SCREW LOCKING 3.5X55 (Screw) ×1 IMPLANT
SCREW NON LOCKING HEX 2.7X20MM (Screw) ×2 IMPLANT
SCREW NON LOCKING HEX 3.5X22 (Screw) ×2 IMPLANT
SLING ARM FOAM STRAP LRG (SOFTGOODS) ×2 IMPLANT
SPONGE LAP 18X18 RF (DISPOSABLE) IMPLANT
STAPLER VISISTAT 35W (STAPLE) ×2 IMPLANT
STOCKINETTE IMPERVIOUS 9X36 MD (GAUZE/BANDAGES/DRESSINGS) IMPLANT
SUCTION FRAZIER HANDLE 10FR (MISCELLANEOUS) ×1
SUCTION TUBE FRAZIER 10FR DISP (MISCELLANEOUS) ×1 IMPLANT
SUT BONE WAX W31G (SUTURE) ×2 IMPLANT
SUT ETHILON 3 0 PS 1 (SUTURE) ×4 IMPLANT
SUT VIC AB 0 CT1 27 (SUTURE) ×4
SUT VIC AB 0 CT1 27XBRD ANBCTR (SUTURE) ×2 IMPLANT
SUT VIC AB 2-0 CT1 27 (SUTURE) ×4
SUT VIC AB 2-0 CT1 TAPERPNT 27 (SUTURE) ×2 IMPLANT
SYR 5ML LL (SYRINGE) IMPLANT
SYR CONTROL 10ML LL (SYRINGE) IMPLANT
TOWEL GREEN STERILE (TOWEL DISPOSABLE) ×6 IMPLANT
TOWEL GREEN STERILE FF (TOWEL DISPOSABLE) ×2 IMPLANT
TRAY FOLEY MTR SLVR 16FR STAT (SET/KITS/TRAYS/PACK) ×2 IMPLANT
WATER STERILE IRR 1000ML POUR (IV SOLUTION) ×2 IMPLANT
YANKAUER SUCT BULB TIP NO VENT (SUCTIONS) IMPLANT

## 2020-03-12 NOTE — Anesthesia Procedure Notes (Signed)
Procedure Name: Intubation Date/Time: 03/12/2020 8:38 AM Performed by: Moshe Salisbury, CRNA Pre-anesthesia Checklist: Patient identified, Emergency Drugs available, Suction available and Patient being monitored Patient Re-evaluated:Patient Re-evaluated prior to induction Oxygen Delivery Method: Circle System Utilized Preoxygenation: Pre-oxygenation with 100% oxygen Induction Type: IV induction Ventilation: Mask ventilation without difficulty Laryngoscope Size: Mac and 3 Grade View: Grade I Tube type: Oral Tube size: 7.0 mm Number of attempts: 1 Airway Equipment and Method: Stylet Placement Confirmation: ETT inserted through vocal cords under direct vision,  positive ETCO2 and breath sounds checked- equal and bilateral Secured at: 23 cm Tube secured with: Tape Dental Injury: Teeth and Oropharynx as per pre-operative assessment  Comments: Performed by Sueanne Margarita, SRNA

## 2020-03-12 NOTE — Transfer of Care (Signed)
Immediate Anesthesia Transfer of Care Note  Patient: Stacey Holmes  Procedure(s) Performed: OPEN REDUCTION INTERNAL FIXATION (ORIF) DISTAL HUMERUS FRACTURE (Right )  Patient Location: PACU  Anesthesia Type:GA combined with regional for post-op pain  Level of Consciousness: drowsy and patient cooperative  Airway & Oxygen Therapy: Patient Spontanous Breathing and Patient connected to nasal cannula oxygen  Post-op Assessment: Report given to RN and Post -op Vital signs reviewed and stable  Post vital signs: Reviewed and stable  Last Vitals:  Vitals Value Taken Time  BP 132/115 03/12/20 1252  Temp    Pulse 92 03/12/20 1252  Resp 16 03/12/20 1252  SpO2 96 % 03/12/20 1252  Vitals shown include unvalidated device data.  Last Pain:  Vitals:   03/12/20 0723  TempSrc:   PainSc: 0-No pain      Patients Stated Pain Goal: 4 (68/03/21 2248)  Complications: No complications documented.

## 2020-03-12 NOTE — H&P (Signed)
[facility] H&P/Consult     Patient ID: Stacey Holmes MRN: [id number] DOB/AGE: 1975/05/25 44 y.o.  Chief Complaint: Right distal HPI: Stacey Holmes is an 44 y.o. female.RHD with fall resulting in distal humerus fracture with intercondylar extension but not a lot of comminution. Denies numbness, tingling, or other associated injury. Eager for repair.  Past Medical History:  Diagnosis Date  . Anemia   . Anxiety   . Bipolar depression ([place])   . Bipolar disorder ([place])   . Depression   . Heart palpitations   . HPV in female   . IBS (irritable bowel syndrome)   . Vaginal Pap smear, abnormal     Past Surgical History:  Procedure Laterality Date  . CARPAL TUNNEL RELEASE  01/22/2012   Procedure: CARPAL TUNNEL RELEASE;  Surgeon: Tennis Must, MD;  Location: [facility] [facility];  Service: Orthopedics;  Laterality: Right;  right carpal tunnel release  . COLONOSCOPY      Family History  Problem Relation Age of Onset  . Hypertension Mother    Social History:  reports that she has never smoked. She has never used smokeless tobacco. She reports previous alcohol use. She reports that she does not use drugs.  Allergies:  Allergies  Allergen Reactions  . Dairy Aid [Lactase] Other (See Comments)    Clogs sinuses  . Gluten Meal Other (See Comments)    Affects mental status  . Penicillins Hives and Rash    Has patient had a PCN reaction causing immediate rash, facial/tongue/throat swelling, SOB or lightheadedness with hypotension: Yes Has patient had a PCN reaction causing severe rash involving mucus membranes or skin necrosis: Yes Has patient had a PCN reaction that required hospitalization: No Has patient had a PCN reaction occurring within the last 10 years: No If all of the above answers are "NO", then may proceed with Cephalosporin use.   . Pork-Derived Products Other (See Comments)    Joint pain    Medications Prior to Admission   Medication Sig Dispense Refill  . calcium carbonate (OS-CAL) 1250 (500 Ca) MG chewable tablet Chew 1 tablet by mouth daily.    . cholecalciferol (VITAMIN D3) 25 MCG (1000 UNIT) tablet Take 1,000 Units by mouth daily.    Marland Kitchen escitalopram (LEXAPRO) 20 MG tablet Take 20 mg by mouth daily.    . ferrous sulfate 325 (65 FE) MG EC tablet Take 325 mg by mouth daily with breakfast.    . oxyCODONE-acetaminophen (PERCOCET/ROXICET) 5-325 MG tablet Take 1 tablet by mouth every 6 (six) hours as needed for severe pain. 10 tablet 0  . acetaminophen (TYLENOL) 500 MG tablet Take 1,000 mg by mouth 3 (three) times daily as needed for moderate pain or headache.      Results for orders placed or performed during the hospital encounter of 03/12/20 (from the past 48 hour(s))  Pregnancy, urine POC     Status: None   Collection Time: 03/12/20  7:15 AM  Result Value Ref Range   Preg Test, Ur NEGATIVE NEGATIVE    Comment:        THE SENSITIVITY OF THIS METHODOLOGY IS >24 mIU/mL    No results found.  ROS noncontributory  Blood pressure 115/69, pulse 72, temperature (!) 97.2 F (36.2 C), temperature source Temporal, resp. rate 17, height 5\' 10"  (1.778 m), weight 70.3 kg, last menstrual period 02/17/2020, SpO2 100 %, unknown if currently breastfeeding. Physical Exam NCAT RRR No audible wheezing or retractions RUEx   Splint  in place  Sens  Ax/R/M/U intact  Mot   Ax/ R/ PIN/ M/ AIN/ U intact  Brisk CR   Assessment/Plan  Right distal humerus fracture with intercondylar extension  I discussed with the patient the risks and benefits of surgery, including the possibility of infection, nerve injury, vessel injury, wound breakdown, arthritis, symptomatic hardware, DVT/ PE, loss of motion, malunion, nonunion, and need for further surgery among others.  We also specifically discussed the increased risk for ulnar neuropathy.  She acknowledged these risks and wished to proceed.  She has opted for regional nerve block,  as well.   Altamese [place], MD [facility], [facility] [phone]  03/12/2020, 8:00 AM  [facility] [place] [address] [phone] (F)

## 2020-03-12 NOTE — Anesthesia Postprocedure Evaluation (Signed)
Anesthesia Post Note  Patient: Stacey Holmes  Procedure(s) Performed: OPEN REDUCTION INTERNAL FIXATION (ORIF) DISTAL HUMERUS FRACTURE (Right )     Patient location during evaluation: PACU Anesthesia Type: General Level of consciousness: awake and alert Pain management: pain level controlled Vital Signs Assessment: post-procedure vital signs reviewed and stable Respiratory status: spontaneous breathing, nonlabored ventilation and respiratory function stable Cardiovascular status: blood pressure returned to baseline and stable Postop Assessment: no apparent nausea or vomiting Anesthetic complications: no   No complications documented.  Last Vitals:  Vitals:   03/12/20 1323 03/12/20 1336  BP: (!) 94/56 101/62  Pulse: 95 92  Resp: (!) 21 18  Temp:    SpO2: 96% 95%    Last Pain:  Vitals:   03/12/20 1323  TempSrc:   PainSc: 0-No pain                 Lidia Collum

## 2020-03-12 NOTE — Anesthesia Procedure Notes (Signed)
Anesthesia Regional Block: Supraclavicular block   Pre-Anesthetic Checklist: ,, timeout performed, Correct Patient, Correct Site, Correct Laterality, Correct Procedure, Correct Position, site marked, Risks and benefits discussed,  Surgical consent,  Pre-op evaluation,  At surgeon's request and post-op pain management  Laterality: Right  Prep: chloraprep       Needles:  Injection technique: Single-shot  Needle Type: Echogenic Stimulator Needle     Needle Length: 10cm  Needle Gauge: 20     Additional Needles:   Procedures:,,,, ultrasound used (permanent image in chart),,,,  Narrative:  Start time: 03/12/2020 8:10 AM End time: 03/12/2020 8:15 AM Injection made incrementally with aspirations every 5 mL.  Performed by: Personally  Anesthesiologist: Lidia Collum, MD  Additional Notes: Standard monitors applied. Skin prepped. Good needle visualization with ultrasound. Injection made in 5cc increments with no resistance to injection. Patient tolerated the procedure well.

## 2020-03-12 NOTE — Anesthesia Preprocedure Evaluation (Addendum)
Anesthesia Evaluation  Patient identified by MRN, date of birth, ID band Patient awake    Reviewed: Allergy & Precautions, NPO status , Patient's Chart, lab work & pertinent test results  History of Anesthesia Complications Negative for: history of anesthetic complications  Airway Mallampati: II  TM Distance: >3 FB Neck ROM: Full    Dental   Pulmonary neg pulmonary ROS,    Pulmonary exam normal        Cardiovascular negative cardio ROS Normal cardiovascular exam     Neuro/Psych Anxiety Depression Bipolar Disorder negative neurological ROS     GI/Hepatic negative GI ROS, Neg liver ROS,   Endo/Other  negative endocrine ROS  Renal/GU negative Renal ROS  negative genitourinary   Musculoskeletal negative musculoskeletal ROS (+)   Abdominal   Peds  Hematology negative hematology ROS (+)   Anesthesia Other Findings   Reproductive/Obstetrics                            Anesthesia Physical Anesthesia Plan  ASA: II  Anesthesia Plan: General   Post-op Pain Management: GA combined w/ Regional for post-op pain   Induction: Intravenous  PONV Risk Score and Plan: 3 and Ondansetron, Dexamethasone, Treatment may vary due to age or medical condition and Midazolam  Airway Management Planned: Oral ETT  Additional Equipment: None  Intra-op Plan:   Post-operative Plan: Extubation in OR  Informed Consent: I have reviewed the patients History and Physical, chart, labs and discussed the procedure including the risks, benefits and alternatives for the proposed anesthesia with the patient or authorized representative who has indicated his/her understanding and acceptance.     Dental advisory given  Plan Discussed with:   Anesthesia Plan Comments:         Anesthesia Quick Evaluation

## 2020-03-12 NOTE — Op Note (Signed)
NAME: Stacey Holmes MEDICAL RECORD TI:[id number] DATE OF BIRTH:November 13, 1975 FACILITY: [facility], [place]  OPERATIVE REPORT  DATE OF PROCEDURE:  03/12/2020  PREOPERATIVE DIAGNOSES:  RIGHT INTERCONDYLAR DISTAL HUMERUS FRACTURE.  POSTOPERATIVE DIAGNOSES:  RIGHT INTERCONDYLAR DISTAL HUMERUS FRACTURE.  PROCEDURE:  1. OPEN REDUCTION INTERNAL FIXATION OF RIGHT INTERCONDYLAR DISTAL HUMERUS FRACTURE. 2. RIGHT ULNAR NERVE NEUROPLASTY.  SURGEON:  Altamese [place], MD  ASSISTANT:  1. Ainsley Spinner, PA-C; 2. PA Student  ANESTHESIA:  General.  COMPLICATIONS:  None.  EBL: 120 mL.  TOURNIQUET:  None.  DISPOSITION:  To PACU.  CONDITION:  Stable.  BRIEF INDICATIONS FOR PROCEDURE:  This is a very pleasant 44 y.o. right-hand dominant patient who sustained a severe intercondylar humerus fracture in a ground-level fall. I discussed with the patient the risks and benefits of surgical treatment including the possibility of nerve injury, vessel injury, DVT, PE, loss of motion, nonunion, malunion, symptomatic hardware, need for further surgery, heart attack, stroke, death, and multiple others. These risks were acknowledged and consent provided to proceed.    BRIEF SUMMARY OF PROCEDURE:  The patient was taken to the operating room after administration of preoperative antibiotics with Ancef which she tolerated well.  The patient was placed laterally with prominences appropriately padded and the operative upper extremity placed carefully over bone foam. Chlorhexidine wash followed by Betadine scrub and paint were used then a standard drape. A tourniquet was placed about the arm. Time out was called. The arm was then elevated and exsanguinated with an Esmarch bandage.  We decided to perform a posterior approach with mobilization of the triceps without an olecranon osteotomy if at all possible.  A posterior incision was made.  Dissection carried carefully down, with hemostasis using electrocautery.  The  triceps was mobilized medially after identification and neuroplasty of the ulnar nerve which was carefully retracted with a quarter inch penrose and facilitated with the bipolar bovie. The neuroplasty was a separate procedure not just to reduce risk during humerus repair, but to free from potential contact with the plate by putting a sleeve of tissue across the floor of the cubital tunnel with inverted 4-0 prolene (to avoid contact with suture also) and to free from tissue that could cause stricture or neuropathy post operatively. An additional lateral incision in the triceps fascia was made as well, and this facilitated mobilization laterally.  On the lateral side once this interval had been established, we placed a lap sponge underneath the triceps, mobilizing it, and enabling direct exposure of the fracture site.  The fracture was again an intercondylar split with significant comminution on the lateral side and one large metaphyseal segment on the medial side.  I did attempt multiple tenaculum placements and pinning in order to secure reduction but was unable to do this anatomically without the assistance of provisional pins in a few places in addition to stepwise use of large tenaculums.  Once this had been established, I was then able to use the Acumed medial and lateral column plate and obtained outstanding reduction and compression across the articular surface and fixation with a mixture of standard and locked screw fixation.  Ainsley Spinner, PA-C, was present and assisting. We were able to take the elbow through a full range of motion with no motion of the fracture components and no complications.  Final images showed appropriate reduction, hardware placement, trajectory, and length.  Of note, an assistant was absolutely necessary for control during this very difficult reduction and assistance with maintenance during both provisional and  definitive fixation.  Furthermore, he protected the ulnar nerve throughout  the instrumentation. The ulnar nerve was not formally transposed. A bulky dressing without splint was applied and the patient taken to the PACU in stable condition.  PROGNOSIS: Stacey Holmes will begin gentle PROM, AAROM of the elbow with AROM of the wrist and digits. Ice and elevation with "the hand of above the elbow and the elbow above the heart." Follow  up in the office in 10-14 days for removal of sutures and ROM advancement with therapy. She is at elevated risk for loss of motion given the comminution and magnitude of injury and for ulnar neuropathy though this risk should be mitigated by formal neuroplasty.  Altamese Shasta, MD

## 2020-03-12 NOTE — Discharge Instructions (Addendum)
[facility] Discharge Instructions   General Discharge Instructions  Orthopaedic Injuries:  Right distal humerus fracture treated with open reduction and internal fixation using plates and screws   WEIGHT BEARING STATUS: nonweightbearing R arm. No lifting with right arm   RANGE OF MOTION/ACTIVITY: ok to start moving your right elbow. Continue with hand/finger motion and shoulder motion. Sling is for comfort   Bone health:  Continue with your vitamin d supplements   Wound Care: daily wound care starting on 03/14/2020. See below.     Discharge Wound Care Instructions  Do NOT apply any ointments, solutions or lotions to pin sites or surgical wounds.  These prevent needed drainage and even though solutions like hydrogen peroxide kill bacteria, they also damage cells lining the pin sites that help fight infection.  Applying lotions or ointments can keep the wounds moist and can cause them to breakdown and open up as well. This can increase the risk for infection. When in doubt call the office.  Surgical incisions should be dressed daily.  If any drainage is noted, use one layer of adaptic, then gauze, Kerlix, and an ace wrap.  Once the incision is completely dry and without drainage, it may be left open to air out.  Showering may begin 36-48 hours later.  Cleaning gently with soap and water.     Diet: as you were eating previously.  Can use over the counter stool softeners and bowel preparations, such as Miralax, to help with bowel movements.  Narcotics can be constipating.  Be sure to drink plenty of fluids  PAIN MEDICATION USE AND EXPECTATIONS  You have likely been given narcotic medications to help control your pain.  After a traumatic event that results in an fracture (broken bone) with or without surgery, it is ok to use narcotic pain medications to help control one's pain.  We understand that everyone responds to pain differently and each individual patient will be  evaluated on a regular basis for the continued need for narcotic medications. Ideally, narcotic medication use should last no more than 6-8 weeks (coinciding with fracture healing).   As a patient it is your responsibility as well to monitor narcotic medication use and report the amount and frequency you use these medications when you come to your office visit.   We would also advise that if you are using narcotic medications, you should take a dose prior to therapy to maximize you participation.  IF YOU ARE ON NARCOTIC MEDICATIONS IT IS NOT PERMISSIBLE TO OPERATE A MOTOR VEHICLE (MOTORCYCLE/CAR/TRUCK/MOPED) OR HEAVY MACHINERY DO NOT MIX NARCOTICS WITH OTHER CNS (CENTRAL NERVOUS SYSTEM) DEPRESSANTS SUCH AS ALCOHOL   STOP SMOKING OR USING NICOTINE PRODUCTS!!!!  As discussed nicotine severely impairs your body's ability to heal surgical and traumatic wounds but also impairs bone healing.  Wounds and bone heal by forming microscopic blood vessels (angiogenesis) and nicotine is a vasoconstrictor (essentially, shrinks blood vessels).  Therefore, if vasoconstriction occurs to these microscopic blood vessels they essentially disappear and are unable to deliver necessary nutrients to the healing tissue.  This is one modifiable factor that you can do to dramatically increase your chances of healing your injury.    (This means no smoking, no nicotine gum, patches, etc)  DO NOT USE NONSTEROIDAL ANTI-INFLAMMATORY DRUGS (NSAID'S)  Using products such as Advil (ibuprofen), Aleve (naproxen), Motrin (ibuprofen) for additional pain control during fracture healing can delay and/or prevent the healing response.  If you would like to take over the counter (  OTC) medication, Tylenol (acetaminophen) is ok.  However, some narcotic medications that are given for pain control contain acetaminophen as well. Therefore, you should not exceed more than 4000 mg of tylenol in a day if you do not have liver disease.  Also note that  there are may OTC medicines, such as cold medicines and allergy medicines that my contain tylenol as well.  If you have any questions about medications and/or interactions please ask your doctor/PA or your pharmacist.      ICE AND ELEVATE INJURED/OPERATIVE EXTREMITY  Using ice and elevating the injured extremity above your heart can help with swelling and pain control.  Icing in a pulsatile fashion, such as 20 minutes on and 20 minutes off, can be followed.    Do not place ice directly on skin. Make sure there is a barrier between to skin and the ice pack.    Using frozen items such as frozen peas works well as the conform nicely to the are that needs to be iced.  USE AN ACE WRAP OR TED HOSE FOR SWELLING CONTROL  In addition to icing and elevation, Ace wraps or TED hose are used to help limit and resolve swelling.  It is recommended to use Ace wraps or TED hose until you are informed to stop.    When using Ace Wraps start the wrapping distally (farthest away from the body) and wrap proximally (closer to the body)   Example: If you had surgery on your leg or thing and you do not have a splint on, start the ace wrap at the toes and work your way up to the thigh        If you had surgery on your upper extremity and do not have a splint on, start the ace wrap at your fingers and work your way up to the upper arm  IF YOU ARE IN A SPLINT OR CAST DO NOT [place]   If your splint gets wet for any reason please contact the office immediately. You may shower in your splint or cast as long as you keep it dry.  This can be done by wrapping in a cast cover or garbage back (or similar)  Do Not stick any thing down your splint or cast such as pencils, money, or hangers to try and scratch yourself with.  If you feel itchy take benadryl as prescribed on the bottle for itching  IF YOU ARE IN A CAM BOOT (BLACK BOOT)  You may remove boot periodically. Perform daily dressing changes as noted below.   Wash the liner of the boot regularly and wear a sock when wearing the boot. It is recommended that you sleep in the boot until told otherwise    Call office for the following:  Temperature greater than 101F  Persistent nausea and vomiting  Severe uncontrolled pain  Redness, tenderness, or signs of infection (pain, swelling, redness, odor or green/yellow discharge around the site)  Difficulty breathing, headache or visual disturbances  Hives  Persistent dizziness or light-headedness  Extreme fatigue  Any other questions or concerns you may have after discharge  In an emergency, call 911 or go to an Emergency Department at a nearby hospital  HELPFUL INFORMATION  ? If you had a block, it will wear off between 8-24 hrs postop typically.  This is period when your pain may go from nearly zero to the pain you would have had postop without the block.  This is an abrupt  transition but nothing dangerous is happening.  You may take an extra dose of narcotic when this happens. As soon as you start to feel anything (tingling, throbbing, etc) start taking your pain medicine.   ? You should wean off your narcotic medicines as soon as you are able.  Most patients will be off or using minimal narcotics before their first postop appointment.   ? We suggest you use the pain medication the first night prior to going to bed, in order to ease any pain when the anesthesia wears off. You should avoid taking pain medications on an empty stomach as it will make you nauseous.  ? Do not drink alcoholic beverages or take illicit drugs when taking pain medications.  ? In most states it is against the law to drive while you are in a splint or sling.  And certainly against the law to drive while taking narcotics.  ? You may return to work/school in the next couple of days when you feel up to it.   ? Pain medication may make you constipated.  Below are a few solutions to try in this order: - Decrease the  amount of pain medication if you aren't having pain. - Drink lots of decaffeinated fluids. - Drink prune juice and/or each dried prunes  o If the first 3 don't work start with additional solutions - Take Colace - an over-the-counter stool softener - Take Senokot - an over-the-counter laxative - Take Miralax - a stronger over-the-counter laxative     CALL THE OFFICE WITH ANY QUESTIONS OR CONCERNS: [phone]   VISIT OUR WEBSITE FOR ADDITIONAL INFORMATION: [email or web address]

## 2020-03-13 ENCOUNTER — Encounter (HOSPITAL_COMMUNITY): Payer: Self-pay | Admitting: Orthopedic Surgery

## 2020-05-28 ENCOUNTER — Other Ambulatory Visit: Payer: Self-pay | Admitting: Orthopedic Surgery

## 2021-02-27 ENCOUNTER — Ambulatory Visit (INDEPENDENT_AMBULATORY_CARE_PROVIDER_SITE_OTHER): Payer: 59 | Admitting: Otolaryngology

## 2021-02-27 ENCOUNTER — Other Ambulatory Visit: Payer: Self-pay

## 2021-02-27 DIAGNOSIS — M26609 Unspecified temporomandibular joint disorder, unspecified side: Secondary | ICD-10-CM | POA: Diagnosis not present

## 2021-02-27 DIAGNOSIS — H9201 Otalgia, right ear: Secondary | ICD-10-CM | POA: Diagnosis not present

## 2021-02-27 NOTE — Progress Notes (Signed)
HPI: Stacey Holmes is a 45 y.o. female who presents is referred by her PCP for evaluation of right ear pain.  She has not noted any hearing problems.  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  The ear is feeling better today.  The pain tends to come and go..  Past Medical History:  Diagnosis Date   Anemia    Anxiety    Bipolar depression ([place])    Bipolar disorder ([place])    Depression    Heart palpitations    HPV in female    IBS (irritable bowel syndrome)    Vaginal Pap smear, abnormal    Past Surgical History:  Procedure Laterality Date   CARPAL TUNNEL RELEASE  01/22/2012   Procedure: CARPAL TUNNEL RELEASE;  Surgeon: Tennis Must, MD;  Location: [place];  Service: Orthopedics;  Laterality: Right;  right carpal tunnel release   COLONOSCOPY     ORIF HUMERUS FRACTURE Right 03/12/2020   Procedure: OPEN REDUCTION INTERNAL FIXATION (ORIF) DISTAL HUMERUS FRACTURE;  Surgeon: Altamese Luke, MD;  Location: [facility];  Service: Orthopedics;  Laterality: Right;   Social History   Socioeconomic History   Marital status: Married    Spouse name: Not on file   Number of children: Not on file   Years of education: Not on file   Highest education level: Not on file  Occupational History   Not on file  Tobacco Use   Smoking status: Never   Smokeless tobacco: Never  Vaping Use   Vaping Use: Never used  Substance and Sexual Activity   Alcohol use: Not Currently    Comment: social   Drug use: No   Sexual activity: Not Currently  Other Topics Concern   Not on file  Social History Narrative   Not on file   Social Determinants of Health   Financial Resource Strain: Not on file  Food Insecurity: Not on file  Transportation Needs: Not on file  Physical Activity: Not on file  Stress: Not on file  Social Connections: Not on file   Family History  Problem Relation Age of Onset   Hypertension Mother    Allergies  Allergen Reactions   Dairy  Aid [Lactase] Other (See Comments)    Clogs sinuses   Gluten Meal Other (See Comments)    Affects mental status   Penicillins Hives and Rash    Has patient had a PCN reaction causing immediate rash, facial/tongue/throat swelling, SOB or lightheadedness with hypotension: Yes Has patient had a PCN reaction causing severe rash involving mucus membranes or skin necrosis: Yes Has patient had a PCN reaction that required hospitalization: No Has patient had a PCN reaction occurring within the last 10 years: No If all of the above answers are "NO", then may proceed with Cephalosporin use.    Pork-Derived Products Other (See Comments)    Joint pain   Prior to Admission medications   Medication Sig Start Date End Date Taking? Authorizing Provider  acetaminophen (TYLENOL) 500 MG tablet Take 1 tablet (500 mg total) by mouth 3 (three) times daily as needed for moderate pain or headache. 03/12/20   Ainsley Spinner, PA-C  calcium carbonate (OS-CAL) 1250 (500 Ca) MG chewable tablet Chew 1 tablet by mouth daily.    [provider]  cholecalciferol (VITAMIN D3) 25 MCG (1000 UNIT) tablet Take 1,000 Units by mouth daily.    [provider]  escitalopram (LEXAPRO) 20 MG tablet Take 20 mg  by mouth daily. 03/06/20   [provider]  ferrous sulfate 325 (65 FE) MG EC tablet Take 325 mg by mouth daily with breakfast.    [provider]  methocarbamol (ROBAXIN) 500 MG tablet Take 1-2 tablets (500-1,000 mg total) by mouth every 6 (six) hours as needed for muscle spasms. 03/12/20   Ainsley Spinner, PA-C  ondansetron (ZOFRAN ODT) 4 MG disintegrating tablet Take 1 tablet (4 mg total) by mouth every 8 (eight) hours as needed for nausea or vomiting. 03/12/20   Ainsley Spinner, PA-C  oxyCODONE-acetaminophen (PERCOCET/ROXICET) 5-325 MG tablet Take 1-2 tablets by mouth every 6 (six) hours as needed for moderate pain or severe pain. 03/12/20   Ainsley Spinner, PA-C     Positive ROS: Otherwise  negative  All other systems have been reviewed and were otherwise negative with the exception of those mentioned in the HPI and as above.  Physical Exam: Constitutional: Alert, well-appearing, no acute distress Ears: External ears without lesions or tenderness.  Right ear canal is clear with no inflammatory changes.  The TM is clear with good mobility on pneumatic otoscopy.  Likewise the left ear canal left TM are clear.  On hearing screening with the 512 1024 tuning fork she hears well in both ears with AC > BC bilaterally. Nasal: External nose without lesions. Septum with mild deformity and mild rhinitis.  Posterior nasal cavity and middle meatus regions were clear..  Oral: Lips and gums without lesions. Tongue and palate mucosa without lesions. Posterior oropharynx clear.  Tonsil regions are benign in appearance bilaterally with no inflammatory changes. Neck: No palpable adenopathy or masses.  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 Respiratory: Breathing comfortably  Skin: No facial/neck lesions or rash noted.  Procedures  Assessment: Right ear pain with normal ear canal and TM on exam today. I suspect this is related to TMJ dysfunction and reviewed this with the patient.  Plan: I discussed with her that I think the right ear pain is from TMJ dysfunction and recommended soft diet and taking NSAIDs as needed pain or discomfort when she has it.  Would also recommend follow-up with her dentist and possible use of a mouthguard at night.   Radene Journey, MD   CC:

## 2021-08-16 ENCOUNTER — Ambulatory Visit
Admission: RE | Admit: 2021-08-16 | Discharge: 2021-08-16 | Disposition: A | Discharge status: Home / Self Care | Payer: 59 | Source: Ambulatory Visit | Attending: Family Medicine | Admitting: Family Medicine

## 2021-08-16 ENCOUNTER — Other Ambulatory Visit: Payer: Self-pay | Admitting: Family Medicine

## 2021-08-16 DIAGNOSIS — M25551 Pain in right hip: Secondary | ICD-10-CM

## 2022-05-22 ENCOUNTER — Encounter: Payer: Self-pay | Admitting: Internal Medicine

## 2022-06-04 ENCOUNTER — Ambulatory Visit (AMBULATORY_SURGERY_CENTER): Payer: 59 | Admitting: *Deleted

## 2022-06-04 VITALS — Ht 69.5 in | Wt 160.0 lb

## 2022-06-04 DIAGNOSIS — Z1211 Encounter for screening for malignant neoplasm of colon: Secondary | ICD-10-CM

## 2022-06-04 MED ORDER — NA SULFATE-K SULFATE-MG SULF 17.5-3.13-1.6 GM/177ML PO SOLN: 1.0000 | Freq: Once | ORAL | 0 refills | Status: AC

## 2022-06-04 NOTE — Progress Notes (Addendum)
No egg or soy allergy known to patient  No issues known to pt with past sedation with any surgeries or procedures Patient denies ever being told they had issues or difficulty with intubation  No FH of Malignant Hyperthermia Pt is not on diet pills Pt is not on  home 02  Pt is not on blood thinners  Pt has issues with constipation fiber gummies and Ducolax Pt is not on dialysis Pt denies any upcoming cardiac testing Pt encouraged to use to use Singlecare or Goodrx to reduce cost  Patient's chart reviewed by Stacey Holmes CNRA prior to previsit and patient appropriate for the [facility].  Previsit completed and red dot placed by patient's name on their procedure day (on provider's schedule).  . Visit by phone Instructions sent by mail  with coupon and my chart

## 2022-06-20 ENCOUNTER — Encounter: Payer: Self-pay | Admitting: Internal Medicine

## 2022-07-01 ENCOUNTER — Ambulatory Visit (AMBULATORY_SURGERY_CENTER): Payer: 59 | Admitting: Internal Medicine

## 2022-07-01 ENCOUNTER — Encounter: Payer: Self-pay | Admitting: Internal Medicine

## 2022-07-01 VITALS — BP 105/66 | HR 75 | Temp 97.8°F | Resp 14 | Ht 69.5 in | Wt 160.0 lb

## 2022-07-01 DIAGNOSIS — D120 Benign neoplasm of cecum: Secondary | ICD-10-CM

## 2022-07-01 DIAGNOSIS — Z1211 Encounter for screening for malignant neoplasm of colon: Secondary | ICD-10-CM | POA: Diagnosis present

## 2022-07-01 MED ORDER — SODIUM CHLORIDE 0.9 % IV SOLN: 500.0000 mL | Freq: Once | INTRAVENOUS | Status: DC

## 2022-07-01 NOTE — Op Note (Signed)
[facility] Patient Name: Stacey Holmes Procedure Date: 07/01/2022 10:02 AM MRN: [id number] Endoscopist: Docia Chuck. Henrene Pastor , MD, [id number] Age: 47 Referring MD:  Date of Birth: 1976-03-22 Gender: Female Account #: [id number] Procedure:                Colonoscopy with cold snare polypectomy x 1 Indications:              Screening for colorectal malignant neoplasm Medicines:                Monitored Anesthesia Care Procedure:                Pre-Anesthesia Assessment:                           - Prior to the procedure, a History and Physical                            was performed, and patient medications and                            allergies were reviewed. The patient's tolerance of                            previous anesthesia was also reviewed. The risks                            and benefits of the procedure and the sedation                            options and risks were discussed with the patient.                            All questions were answered, and informed consent                            was obtained. Prior Anticoagulants: The patient has                            taken no anticoagulant or antiplatelet agents. ASA                            Grade Assessment: II - A patient with mild systemic                            disease. After reviewing the risks and benefits,                            the patient was deemed in satisfactory condition to                            undergo the procedure.                           After obtaining informed consent, the colonoscope  was passed under direct vision. Throughout the                            procedure, the patient's blood pressure, pulse, and                            oxygen saturations were monitored continuously. The                            CF [id number] was introduced through the anus                            and advanced to the the cecum, identified by                             appendiceal orifice and ileocecal valve. The                            ileocecal valve, appendiceal orifice, and rectum                            were photographed. The quality of the bowel                            preparation was excellent. The colonoscopy was                            performed without difficulty. The patient tolerated                            the procedure well. The bowel preparation used was                            SUPREP via split dose instruction. Scope In: 10:17:11 AM Scope Out: 10:30:20 AM Scope Withdrawal Time: 0 hours 10 minutes 35 seconds  Total Procedure Duration: 0 hours 13 minutes 9 seconds  Findings:                 A 3 mm polyp was found in the cecum. The polyp was                            removed with a cold snare. Resection and retrieval                            were complete.                           Internal hemorrhoids were found during                            retroflexion. The hemorrhoids were small.                           The exam was otherwise without abnormality on  direct and retroflexion views. Complications:            No immediate complications. Estimated blood loss:                            None. Estimated Blood Loss:     Estimated blood loss: none. Impression:               - One 3 mm polyp in the cecum, removed with a cold                            snare. Resected and retrieved.                           - Internal hemorrhoids.                           - The examination was otherwise normal on direct                            and retroflexion views. Recommendation:           - Repeat colonoscopy in 7-10 years for surveillance.                           - Patient has a contact number available for                            emergencies. The signs and symptoms of potential                            delayed complications were discussed with the                             patient. Return to normal activities tomorrow.                            Written discharge instructions were provided to the                            patient.                           - Resume previous diet.                           - Continue present medications.                           - Await pathology results. Docia Chuck. Henrene Pastor, MD 07/01/2022 10:34:46 AM This report has been signed electronically.

## 2022-07-01 NOTE — Progress Notes (Signed)
HISTORY OF PRESENT ILLNESS:  Stacey Holmes is a 47 y.o. female who presents for routine screening colonoscopy.  No complaints  REVIEW OF SYSTEMS:  All non-GI ROS negative except for  Past Medical History:  Diagnosis Date   Anemia    Anxiety    Bipolar depression ([place])    Bipolar disorder ([place])    Depression    Heart palpitations    HPV in female    IBS (irritable bowel syndrome)    Sleep apnea    Vaginal Pap smear, abnormal     Past Surgical History:  Procedure Laterality Date   CARPAL TUNNEL RELEASE  01/22/2012   Procedure: CARPAL TUNNEL RELEASE;  Surgeon: Tennis Must, MD;  Location: [facility];  Service: Orthopedics;  Laterality: Right;  right carpal tunnel release   COLONOSCOPY     ORIF HUMERUS FRACTURE Right 03/12/2020   Procedure: OPEN REDUCTION INTERNAL FIXATION (ORIF) DISTAL HUMERUS FRACTURE;  Surgeon: Altamese Hickory, MD;  Location: [facility];  Service: Orthopedics;  Laterality: Right;    Social History Stacey Holmes  reports that she has never smoked. She has never used smokeless tobacco. She reports that she does not currently use alcohol. She reports that she does not use drugs.  family history includes Hypertension in her mother.  Allergies  Allergen Reactions   Dairy Aid [Tilactase] Other (See Comments)    Clogs sinuses   Gluten Meal Other (See Comments)    Affects mental status   Penicillins Hives and Rash    Has patient had a PCN reaction causing immediate rash, facial/tongue/throat swelling, SOB or lightheadedness with hypotension: Yes Has patient had a PCN reaction causing severe rash involving mucus membranes or skin necrosis: Yes Has patient had a PCN reaction that required hospitalization: No Has patient had a PCN reaction occurring within the last 10 years: No If all of the above answers are "NO", then may proceed with Cephalosporin use.    Pork-Derived Products Other (See Comments)    Joint pain       PHYSICAL  EXAMINATION: Vital signs: BP (!) 98/51   Pulse 89   Temp 97.8 F (36.6 C)   Ht 5' 9.5" (1.765 m)   Wt 160 lb (72.6 kg)   SpO2 100%   BMI 23.29 kg/m  General: Well-developed, well-nourished, no acute distress HEENT: Sclerae are anicteric, conjunctiva pink. Oral mucosa intact Lungs: Clear Heart: Regular Abdomen: soft, nontender, nondistended, no obvious ascites, no peritoneal signs, normal bowel sounds. No organomegaly. Extremities: No edema Psychiatric: alert and oriented x3. Cooperative     ASSESSMENT:   Colon cancer screening  PLAN:  Screening colonoscopy

## 2022-07-01 NOTE — Progress Notes (Signed)
Vss nad trans to pacu °

## 2022-07-01 NOTE — Patient Instructions (Signed)
YOU HAD AN ENDOSCOPIC PROCEDURE TODAY AT [facility]:   Refer to the procedure report that was given to you for any specific questions about what was found during the examination.  If the procedure report does not answer your questions, please call your gastroenterologist to clarify.  If you requested that your care partner not be given the details of your procedure findings, then the procedure report has been included in a sealed envelope for you to review at your convenience later.  **handouts given on polyps and hemorrhoids**  YOU SHOULD EXPECT: Some feelings of bloating in the abdomen. Passage of more gas than usual.  Walking can help get rid of the air that was put into your GI tract during the procedure and reduce the bloating. If you had a lower endoscopy (such as a colonoscopy or flexible sigmoidoscopy) you may notice spotting of blood in your stool or on the toilet paper. If you underwent a bowel prep for your procedure, you may not have a normal bowel movement for a few days.  Please Note:  You might notice some irritation and congestion in your nose or some drainage.  This is from the oxygen used during your procedure.  There is no need for concern and it should clear up in a day or so.  SYMPTOMS TO REPORT IMMEDIATELY:  Following lower endoscopy (colonoscopy or flexible sigmoidoscopy):  Excessive amounts of blood in the stool  Significant tenderness or worsening of abdominal pains  Swelling of the abdomen that is new, acute  Fever of 100F or higher  For urgent or emergent issues, a gastroenterologist can be reached at any hour by calling [phone]. Do not use MyChart messaging for urgent concerns.    DIET:  We do recommend a small meal at first, but then you may proceed to your regular diet.  Drink plenty of fluids but you should avoid alcoholic beverages for 24 hours.  ACTIVITY:  You should plan to take it easy for the rest of today and you should NOT DRIVE or  use heavy machinery until tomorrow (because of the sedation medicines used during the test).    FOLLOW UP: Our staff will call the number listed on your records the next business day following your procedure.  We will call around 7:15- 8:00 am to check on you and address any questions or concerns that you may have regarding the information given to you following your procedure. If we do not reach you, we will leave a message.     If any biopsies were taken you will be contacted by phone or by letter within the next 1-3 weeks.  Please call us at [phone] if you have not heard about the biopsies in 3 weeks.    SIGNATURES/CONFIDENTIALITY: You and/or your care partner have signed paperwork which will be entered into your electronic medical record.  These signatures attest to the fact that that the information above on your After Visit Summary has been reviewed and is understood.  Full responsibility of the confidentiality of this discharge information lies with you and/or your care-partner.

## 2022-07-01 NOTE — Progress Notes (Signed)
Called to room to assist during endoscopic procedure.  Patient ID and intended procedure confirmed with present staff. Received instructions for my participation in the procedure from the performing physician.  

## 2022-07-02 ENCOUNTER — Telehealth: Payer: Self-pay | Admitting: *Deleted

## 2022-07-02 NOTE — Telephone Encounter (Signed)
  Follow up Call-     07/01/2022    8:49 AM  Call back number  Post procedure Call Back phone  # [phone]  Permission to leave phone message No     Patient questions:  Do you have a fever, pain , or abdominal swelling? No. Pain Score  0 *  Have you tolerated food without any problems? Yes.    Have you been able to return to your normal activities? Yes.    Do you have any questions about your discharge instructions: Diet   No. Medications  No. Follow up visit  No.  Do you have questions or concerns about your Care? No.  Actions: * If pain score is 4 or above: No action needed, pain <4.

## 2022-07-11 ENCOUNTER — Encounter: Payer: Self-pay | Admitting: Internal Medicine

## 2023-02-18 ENCOUNTER — Other Ambulatory Visit: Payer: 59

## 2023-02-18 ENCOUNTER — Ambulatory Visit: Payer: 59 | Admitting: Internal Medicine

## 2023-02-18 ENCOUNTER — Encounter: Payer: Self-pay | Admitting: Internal Medicine

## 2023-02-18 VITALS — BP 102/68 | HR 70 | Ht 69.5 in | Wt 157.2 lb

## 2023-02-18 DIAGNOSIS — R140 Abdominal distension (gaseous): Secondary | ICD-10-CM

## 2023-02-18 DIAGNOSIS — R1030 Lower abdominal pain, unspecified: Secondary | ICD-10-CM

## 2023-02-18 DIAGNOSIS — R1010 Upper abdominal pain, unspecified: Secondary | ICD-10-CM

## 2023-02-18 DIAGNOSIS — R194 Change in bowel habit: Secondary | ICD-10-CM

## 2023-02-18 DIAGNOSIS — K219 Gastro-esophageal reflux disease without esophagitis: Secondary | ICD-10-CM

## 2023-02-18 MED ORDER — HYOSCYAMINE SULFATE 0.125 MG SL SUBL: SUBLINGUAL_TABLET | SUBLINGUAL | 0 refills | Status: AC

## 2023-02-18 NOTE — Progress Notes (Signed)
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  This is her first GI office visit.  She is accompanied by her husband.  Patient and her husband state that she has had abdominal complaints for many years.  The majority of the complaints have centered around alternating bowel habits, change in stool caliber at times, abdominal bloating, and lower abdominal cramping pain.  Some mucus per rectum.  She feels that her symptoms have worsened over the past year.  Symptoms are worse in the evening.  She did undergo complete colonoscopy February 2024 for the purposes of screening.  The examination was normal except for a diminutive cecal adenoma which was removed.  Incidental hemorrhoids noted.  Follow-up in 7 years recommended..  Patient states that her symptoms seem worse after meals.  Cannot identify a particular precipitant though does say that symptoms were quite bad in July when she decided to initiate a low FODMAP diet.  She thinks this has helped some.  Next, more recently she describes an upper abdominal pain in the epigastric region.  Described as severe.  She wondered if she needed to go to the hospital.  This seems different.  She does have occasional reflux symptoms.  She does take occasional ibuprofen for aches and pains.  She does not take PPI.  Her weight has been stable.  She states that routine blood work with her PCP (not available) has been unremarkable.  REVIEW OF SYSTEMS:  All non-GI ROS negative unless otherwise stated in the HPI.  Past Medical History:  Diagnosis Date   Anemia    Anxiety    Bipolar depression ([facility])    Bipolar disorder ([facility])    Depression    Heart palpitations    HPV in female    IBS (irritable bowel syndrome)    Sleep apnea    Vaginal Pap smear, abnormal     Past Surgical History:  Procedure Laterality  Date   CARPAL TUNNEL RELEASE  01/22/2012   Procedure: CARPAL TUNNEL RELEASE;  Surgeon: Tami Ribas, MD;  Location: [facility];  Service: Orthopedics;  Laterality: Right;  right carpal tunnel release   COLONOSCOPY     ORIF HUMERUS FRACTURE Right 03/12/2020   Procedure: OPEN REDUCTION INTERNAL FIXATION (ORIF) DISTAL HUMERUS FRACTURE;  Surgeon: Myrene Galas, MD;  Location[facility];  Service: Orthopedics;  Laterality: Right;    Social History Stacey Holmes  reports that she has never smoked. She has never used smokeless tobacco. She reports that she does not currently use alcohol. She reports that she does not use drugs.  family history includes Hypertension in her mother.  Allergies  Allergen Reactions   Dairy Aid [Tilactase] Other (See Comments)    Clogs sinuses   Gluten Meal Other (See Comments)    Affects mental status   Penicillins Hives and Rash    Has patient had a PCN reaction causing immediate rash, facial/tongue/throat swelling, SOB or lightheadedness with hypotension: Yes Has patient had a PCN reaction causing severe rash involving mucus membranes or skin necrosis: Yes Has patient had a PCN reaction that required hospitalization: No Has patient had a PCN reaction occurring within the last 10 years: No If all of the above answers are "NO", then may proceed with Cephalosporin use.    Pork-Derived Products Other (See Comments)    Joint pain  PHYSICAL EXAMINATION: Vital signs: BP 102/68   Pulse 70   Ht 5' 9.5" (1.765 m)   Wt 157 lb 3.2 oz (71.3 kg)   LMP 01/27/2023 (Approximate)   BMI 22.88 kg/m   Constitutional: generally well-appearing, no acute distress Psychiatric: alert and oriented x3, cooperative Eyes: extraocular movements intact, anicteric, conjunctiva pink Mouth: oral pharynx moist, no lesions Neck: supple no lymphadenopathy Cardiovascular: heart regular rate and rhythm, no murmur Lungs: clear to auscultation bilaterally Abdomen: soft,  nontender, nondistended, no obvious ascites, no peritoneal signs, normal bowel sounds, no organomegaly Rectal: Omitted.  Normal previously at colonoscopy Extremities: no clubbing, cyanosis, or lower extremity edema bilaterally Skin: no lesions on visible extremities Neuro: No focal deficits.  Cranial nerves intact  ASSESSMENT:  1.  Chronic alternating bowel habits with bloating and abdominal pain often exacerbated by meals.  Most consistent with irritable bowel syndrome.  Rule out other entities. 2.  More recent upper abdominal pain.  Described as severe and different.  Rule out upper GI entities such as GERD or NSAID induced ulcers. 3.  GERD.  Intermittent symptoms with dietary indiscretion by report 4.  History of adenomatous colon polyp February 2024   PLAN:  1.  Screening for celiac disease with tissue transglutaminase antibody IgA and serum IgA level 2.  Prescribe Levsin sublingual 0.125 mg; #30; several refills; 1-2 SL every 4 hours as needed pain 3.  Initiate Citrucel 2 tablespoons daily to see if we might be able to regulate her bowel habits. 4.  Schedule abdominal ultrasound to evaluate severe abdominal pain 5.  Schedule upper endoscopy to evaluate severe upper abdominal pain, GERD symptoms and bloating.  Would perform biopsies. 6.  Further recommendations and follow-up to be determined after the above. A total time of 60 minutes was spent preparing to see the patient, obtaining comprehensive history, performing medically appropriate physical examination, ordering medication, ordering laboratory studies, ordering advanced radiology, ordering upper endoscopy.  Also, counseling and educating the patient and her husband regarding the above listed issues and answering their questions.  Finally, documenting clinical information in the health record

## 2023-02-18 NOTE — Patient Instructions (Signed)
Your provider has requested that you go to the basement level for lab work before leaving today. Press "B" on the elevator. The lab is located at the first door on the left as you exit the elevator.  We have sent the following medications to your pharmacy for you to pick up at your convenience:  Levsin  You have been scheduled for an abdominal ultrasound at [facility] (1st floor of hospital) on 02/25/2023 at 8:00am. Please arrive 30 minutes prior to your appointment for registration. Make certain not to have anything to eat or drink after midnight  prior to your appointment. Should you need to reschedule your appointment, please contact radiology at [phone]. This test typically takes about 30 minutes to perform.  You have been scheduled for an endoscopy. Please follow written instructions given to you at your visit today.  If you use inhalers (even only as needed), please bring them with you on the day of your procedure.  If you take any of the following medications, they will need to be adjusted prior to your procedure:   DO NOT TAKE 7 DAYS PRIOR TO TEST- Trulicity (dulaglutide) Ozempic, Wegovy (semaglutide) Mounjaro (tirzepatide) Bydureon Bcise (exanatide extended release)  DO NOT TAKE 1 DAY PRIOR TO YOUR TEST Rybelsus (semaglutide) Adlyxin (lixisenatide) Victoza (liraglutide) Byetta (exanatide) ___________________________________________________________________________   _______________________________________________________  If your blood pressure at your visit was 140/90 or greater, please contact your primary care physician to follow up on this.  _______________________________________________________  If you are age 58 or older, your body mass index should be between 23-30. Your Body mass index is 22.88 kg/m. If this is out of the aforementioned range listed, please consider follow up with your Primary Care Provider.  If you are age 52 or younger, your body  mass index should be between 19-25. Your Body mass index is 22.88 kg/m. If this is out of the aformentioned range listed, please consider follow up with your Primary Care Provider.   ________________________________________________________  The [facility] providers would like to encourage you to use [facility] to communicate with providers for non-urgent requests or questions.  Due to long hold times on the telephone, sending your provider a message by [facility] may be a faster and more efficient way to get a response.  Please allow 48 business hours for a response.  Please remember that this is for non-urgent requests.  _______________________________________________________

## 2023-02-19 LAB — TISSUE TRANSGLUTAMINASE, IGA: (tTG) Ab, IgA: <1 U/mL

## 2023-02-19 LAB — IGA: Immunoglobulin A: 128 mg/dL (ref 47–310)

## 2023-02-20 ENCOUNTER — Encounter: Payer: Self-pay | Admitting: Internal Medicine

## 2023-02-20 ENCOUNTER — Ambulatory Visit (AMBULATORY_SURGERY_CENTER): Payer: 59 | Admitting: Internal Medicine

## 2023-02-20 VITALS — BP 112/75 | HR 75 | Temp 97.8°F | Resp 15 | Ht 69.5 in | Wt 157.0 lb

## 2023-02-20 DIAGNOSIS — K317 Polyp of stomach and duodenum: Secondary | ICD-10-CM

## 2023-02-20 DIAGNOSIS — R1010 Upper abdominal pain, unspecified: Secondary | ICD-10-CM

## 2023-02-20 DIAGNOSIS — R140 Abdominal distension (gaseous): Secondary | ICD-10-CM

## 2023-02-20 DIAGNOSIS — K219 Gastro-esophageal reflux disease without esophagitis: Secondary | ICD-10-CM

## 2023-02-20 DIAGNOSIS — R1030 Lower abdominal pain, unspecified: Secondary | ICD-10-CM

## 2023-02-20 MED ORDER — SODIUM CHLORIDE 0.9 % IV SOLN: 500.0000 mL | Freq: Once | INTRAVENOUS | Status: AC

## 2023-02-20 NOTE — Progress Notes (Signed)
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  This is her first GI office visit.  She is accompanied by her husband.   Patient and her husband state that she has had abdominal complaints for many years.  The majority of the complaints have centered around alternating bowel habits, change in stool caliber at times, abdominal bloating, and lower abdominal cramping pain.  Some mucus per rectum.  She feels that her symptoms have worsened over the past year.  Symptoms are worse in the evening.  She did undergo complete colonoscopy February 2024 for the purposes of screening.  The examination was normal except for a diminutive cecal adenoma which was removed.  Incidental hemorrhoids noted.  Follow-up in 7 years recommended..  Patient states that her symptoms seem worse after meals.  Cannot identify a particular precipitant though does say that symptoms were quite bad in July when she decided to initiate a low FODMAP diet.  She thinks this has helped some.   Next, more recently she describes an upper abdominal pain in the epigastric region.  Described as severe.  She wondered if she needed to go to the hospital.  This seems different.  She does have occasional reflux symptoms.  She does take occasional ibuprofen for aches and pains.  She does not take PPI.  Her weight has been stable.   She states that routine blood work with her PCP (not available) has been unremarkable.   REVIEW OF SYSTEMS:   All non-GI ROS negative unless otherwise stated in the HPI.       Past Medical History:  Diagnosis Date   Anemia     Anxiety     Bipolar depression (HCC)     Bipolar disorder (HCC)     Depression     Heart palpitations     HPV in female     IBS (irritable bowel syndrome)     Sleep apnea     Vaginal Pap smear, abnormal                  Past Surgical History:  Procedure Laterality Date   CARPAL TUNNEL RELEASE   01/22/2012    Procedure: CARPAL TUNNEL RELEASE;  Surgeon: Tami Ribas, MD;  Location: [facility];  Service: Orthopedics;  Laterality: Right;  right carpal tunnel release   COLONOSCOPY       ORIF HUMERUS FRACTURE Right 03/12/2020    Procedure: OPEN REDUCTION INTERNAL FIXATION (ORIF) DISTAL HUMERUS FRACTURE;  Surgeon: Myrene Galas, MD;  Location: [facility] OR;  Service: Orthopedics;  Laterality: Right;          Social History Stacey Holmes  reports that she has never smoked. She has never used smokeless tobacco. She reports that she does not currently use alcohol. She reports that she does not use drugs.   family history includes Hypertension in her mother.   Allergies       Allergies  Allergen Reactions   Dairy Aid [Tilactase] Other (See Comments)      Clogs sinuses   Gluten Meal Other (See Comments)      Affects mental status   Penicillins Hives and Rash      Has patient had a PCN reaction causing immediate rash, facial/tongue/throat swelling, SOB or lightheadedness with hypotension: Yes Has patient had a PCN reaction causing severe  rash involving mucus membranes or skin necrosis: Yes Has patient had a PCN reaction that required hospitalization: No Has patient had a PCN reaction occurring within the last 10 years: No If all of the above answers are "NO", then may proceed with Cephalosporin use.     Pork-Derived Products Other (See Comments)      Joint pain            PHYSICAL EXAMINATION: Vital signs: BP 102/68   Pulse 70   Ht 5' 9.5" (1.765 m)   Wt 157 lb 3.2 oz (71.3 kg)   LMP 01/27/2023 (Approximate)   BMI 22.88 kg/m   Constitutional: generally well-appearing, no acute distress Psychiatric: alert and oriented x3, cooperative Eyes: extraocular movements intact, anicteric, conjunctiva pink Mouth: oral pharynx moist, no lesions Neck: supple no  lymphadenopathy Cardiovascular: heart regular rate and rhythm, no murmur Lungs: clear to auscultation bilaterally Abdomen: soft, nontender, nondistended, no obvious ascites, no peritoneal signs, normal bowel sounds, no organomegaly Rectal: Omitted.  Normal previously at colonoscopy Extremities: no clubbing, cyanosis, or lower extremity edema bilaterally Skin: no lesions on visible extremities Neuro: No focal deficits.  Cranial nerves intact   ASSESSMENT:   1.  Chronic alternating bowel habits with bloating and abdominal pain often exacerbated by meals.  Most consistent with irritable bowel syndrome.  Rule out other entities. 2.  More recent upper abdominal pain.  Described as severe and different.  Rule out upper GI entities such as GERD or NSAID induced ulcers. 3.  GERD.  Intermittent symptoms with dietary indiscretion by report 4.  History of adenomatous colon polyp February 2024     PLAN:   1.  Screening for celiac disease with tissue transglutaminase antibody IgA and serum IgA level 2.  Prescribe Levsin sublingual 0.125 mg; #30; several refills; 1-2 SL every 4 hours as needed pain 3.  Initiate Citrucel 2 tablespoons daily to see if we might be able to regulate her bowel habits. 4.  Schedule abdominal ultrasound to evaluate severe abdominal pain 5.  Schedule upper endoscopy to evaluate severe upper abdominal pain, GERD symptoms and bloating.  Would perform biopsies. 6.  Further recommendations and follow-up to be determined after the above.

## 2023-02-20 NOTE — Progress Notes (Signed)
Called to room to assist during endoscopic procedure.  Patient ID and intended procedure confirmed with present staff. Received instructions for my participation in the procedure from the performing physician.  

## 2023-02-20 NOTE — Progress Notes (Signed)
Report to PACU, RN, vss, BBS= Clear.  

## 2023-02-20 NOTE — Patient Instructions (Signed)
YOU HAD AN ENDOSCOPIC PROCEDURE TODAY AT THE [facility]:   Refer to the procedure report that was given to you for any specific questions about what was found during the examination.  If the procedure report does not answer your questions, please call your gastroenterologist to clarify.  If you requested that your care partner not be given the details of your procedure findings, then the procedure report has been included in a sealed envelope for you to review at your convenience later.  YOU SHOULD EXPECT: Some feelings of bloating in the abdomen. Passage of more gas than usual.  Walking can help get rid of the air that was put into your GI tract during the procedure and reduce the bloating. If you had a lower endoscopy (such as a colonoscopy or flexible sigmoidoscopy) you may notice spotting of blood in your stool or on the toilet paper. If you underwent a bowel prep for your procedure, you may not have a normal bowel movement for a few days.  Please Note:  You might notice some irritation and congestion in your nose or some drainage.  This is from the oxygen used during your procedure.  There is no need for concern and it should clear up in a day or so.  SYMPTOMS TO REPORT IMMEDIATELY:   Following upper endoscopy (EGD)  Vomiting of blood or coffee ground material  New chest pain or pain under the shoulder blades  Painful or persistently difficult swallowing  New shortness of breath  Fever of 100F or higher  Black, tarry-looking stools  For urgent or emergent issues, a gastroenterologist can be reached at any hour by calling [phone]. Do not use MyChart messaging for urgent concerns.    DIET:  We do recommend a small meal at first, but then you may proceed to your regular diet.  Drink plenty of fluids but you should avoid alcoholic beverages for 24 hours.  ACTIVITY:  You should plan to take it easy for the rest of today and you should NOT DRIVE or use heavy machinery  until tomorrow (because of the sedation medicines used during the test).    FOLLOW UP: Our staff will call the number listed on your records the next business day following your procedure.  We will call around 7:15- 8:00 am to check on you and address any questions or concerns that you may have regarding the information given to you following your procedure. If we do not reach you, we will leave a message.     If any biopsies were taken you will be contacted by phone or by letter within the next 1-3 weeks.  Please call us at [phone] if you have not heard about the biopsies in 3 weeks.    SIGNATURES/CONFIDENTIALITY: You and/or your care partner have signed paperwork which will be entered into your electronic medical record.  These signatures attest to the fact that that the information above on your After Visit Summary has been reviewed and is understood.  Full responsibility of the confidentiality of this discharge information lies with you and/or your care-partner. 

## 2023-02-20 NOTE — Op Note (Signed)
[facility] Patient Name: Stacey Holmes Procedure Date: 02/20/2023 9:37 AM MRN: [id number] Endoscopist: Wilhemina Bonito. Marina Goodell , MD, [id number] Age: 47 Referring MD:  Date of Birth: [birth year] Gender: Female Account #: [id number] Procedure:                Upper GI endoscopy with bx Indications:              Upper abdominal pain Medicines:                Monitored Anesthesia Care Procedure:                Pre-Anesthesia Assessment:                           - Prior to the procedure, a History and Physical                            was performed, and patient medications and                            allergies were reviewed. The patient's tolerance of                            previous anesthesia was also reviewed. The risks                            and benefits of the procedure and the sedation                            options and risks were discussed with the patient.                            All questions were answered, and informed consent                            was obtained. Prior Anticoagulants: The patient has                            taken no anticoagulant or antiplatelet agents. ASA                            Grade Assessment: II - A patient with mild systemic                            disease. After reviewing the risks and benefits,                            the patient was deemed in satisfactory condition to                            undergo the procedure.                           After obtaining informed consent, the endoscope was  passed under direct vision. Throughout the                            procedure, the patient's blood pressure, pulse, and                            oxygen saturations were monitored continuously. The                            GIF [id number] #[id number] was introduced through the                            mouth, and advanced to the second part of duodenum.                            The upper GI endoscopy  was accomplished without                            difficulty. The patient tolerated the procedure                            well. Scope In: Scope Out: Findings:                 The esophagus was normal.                           The stomach was normal save small benign fundic                            gland type polys. Biopsies for confirmation were                            taken with a cold forceps for histology.                           The examined duodenum was normal.                           The cardia and gastric fundus were normal on                            retroflexion. Complications:            No immediate complications. Estimated Blood Loss:     Estimated blood loss: none. Impression:               - Normal esophagus.                           - Normal stomach. Gastric polyps. Biopsied.                           - Normal examined duodenum. Recommendation:           - Patient has a contact number available for  emergencies. The signs and symptoms of potential                            delayed complications were discussed with the                            patient. Return to normal activities tomorrow.                            Written discharge instructions were provided to the                            patient.                           - Resume previous diet.                           - Continue present medications.                           - Await pathology results.                           - Keep plans for ultrasound                           - Follow up to be determined thereafter Wilhemina Bonito. Marina Goodell, MD 02/20/2023 9:57:41 AM This report has been signed electronically.

## 2023-02-23 ENCOUNTER — Telehealth: Payer: Self-pay

## 2023-02-23 NOTE — Telephone Encounter (Signed)
  Follow up Call-     02/20/2023    8:34 AM 07/01/2022    8:49 AM  Call back number  Post procedure Call Back phone  # [phone]  Permission to leave phone message Yes No     Patient questions:  Do you have a fever, pain , or abdominal swelling? No. Pain Score  0 *  Have you tolerated food without any problems? Yes.    Have you been able to return to your normal activities? Yes.    Do you have any questions about your discharge instructions: Diet   No. Medications  No. Follow up visit  No.  Do you have questions or concerns about your Care? No.  Actions: * If pain score is 4 or above: No action needed, pain <4.

## 2023-02-24 ENCOUNTER — Encounter: Payer: Self-pay | Admitting: Internal Medicine

## 2023-02-24 LAB — SURGICAL PATHOLOGY

## 2023-02-25 ENCOUNTER — Ambulatory Visit (HOSPITAL_COMMUNITY)
Admission: RE | Admit: 2023-02-25 | Discharge: 2023-02-25 | Disposition: A | Discharge status: Home / Self Care | Payer: 59 | Source: Ambulatory Visit | Attending: Internal Medicine | Admitting: Internal Medicine

## 2023-02-25 DIAGNOSIS — R194 Change in bowel habit: Secondary | ICD-10-CM | POA: Insufficient documentation

## 2023-02-25 DIAGNOSIS — R1030 Lower abdominal pain, unspecified: Secondary | ICD-10-CM | POA: Insufficient documentation

## 2023-02-25 DIAGNOSIS — R140 Abdominal distension (gaseous): Secondary | ICD-10-CM | POA: Diagnosis present

## 2023-02-25 DIAGNOSIS — R1010 Upper abdominal pain, unspecified: Secondary | ICD-10-CM | POA: Diagnosis present

## 2023-04-28 ENCOUNTER — Ambulatory Visit: Payer: 59 | Admitting: Internal Medicine

## 2023-04-28 ENCOUNTER — Encounter: Payer: Self-pay | Admitting: Internal Medicine

## 2023-04-28 VITALS — BP 120/68 | HR 75 | Ht 70.0 in | Wt 157.0 lb

## 2023-04-28 DIAGNOSIS — R140 Abdominal distension (gaseous): Secondary | ICD-10-CM | POA: Diagnosis not present

## 2023-04-28 DIAGNOSIS — Z860101 Personal history of adenomatous and serrated colon polyps: Secondary | ICD-10-CM

## 2023-04-28 DIAGNOSIS — R142 Eructation: Secondary | ICD-10-CM | POA: Diagnosis not present

## 2023-04-28 DIAGNOSIS — R1010 Upper abdominal pain, unspecified: Secondary | ICD-10-CM

## 2023-04-28 DIAGNOSIS — R1013 Epigastric pain: Secondary | ICD-10-CM

## 2023-04-28 DIAGNOSIS — K219 Gastro-esophageal reflux disease without esophagitis: Secondary | ICD-10-CM

## 2023-04-28 DIAGNOSIS — K589 Irritable bowel syndrome without diarrhea: Secondary | ICD-10-CM | POA: Diagnosis not present

## 2023-04-28 MED ORDER — METRONIDAZOLE 250 MG PO TABS: 250.0000 mg | ORAL_TABLET | Freq: Three times a day (TID) | ORAL | 0 refills | Status: AC

## 2023-04-28 NOTE — Patient Instructions (Signed)
We have sent the following medications to your pharmacy for you to pick up at your convenience:  Metronidazole  Take IB Gard over the counter - one 10 minutes before meals.   _______________________________________________________  If your blood pressure at your visit was 140/90 or greater, please contact your primary care physician to follow up on this.  _______________________________________________________  If you are age 49 or older, your body mass index should be between 23-30. Your Body mass index is 22.53 kg/m. If this is out of the aforementioned range listed, please consider follow up with your Primary Care Provider.  If you are age 40 or younger, your body mass index should be between 19-25. Your Body mass index is 22.53 kg/m. If this is out of the aformentioned range listed, please consider follow up with your Primary Care Provider.   ________________________________________________________  The [facility] providers would like to encourage you to use [facility] to communicate with providers for non-urgent requests or questions.  Due to long hold times on the telephone, sending your provider a message by [facility] may be a faster and more efficient way to get a response.  Please allow 48 business hours for a response.  Please remember that this is for non-urgent requests.  _______________________________________________________

## 2023-04-28 NOTE — Progress Notes (Signed)
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  She underwent routine screening colonoscopy February 2024.  The examination was normal except for diminutive cecal adenoma which was removed, and internal hemorrhoids.  She was subsequently seen in the office February 18, 2023 regarding chronic alternating bowel habits with bloating and abdominal pain felt to represent, most likely, irritable bowel syndrome.  She was also complaining of more recent upper abdominal pain described as different and severe.  Finally, some mild GERD symptoms.  See that dictation for details.  Screening for celiac disease was negative.  She was prescribed Levsin sublingual.  She was told to initiate Citrucel 2 tablespoons daily, which she has.  Abdominal ultrasound was unremarkable.  Upper endoscopy was normal except for benign fundic gland polyp confirmed by biopsy.  Follow-up at this time recommended.  First, the patient tells me that her irregular bowel habits have improved significantly with daily Citrucel supplementation.  No significant reflux symptoms.  Not on reflux medication.  She does continue to complain of issues with bloating and belching.  She wonders about dietary management strategies..  No new complaints  REVIEW OF SYSTEMS:  All non-GI ROS negative. Past Medical History:  Diagnosis Date   Anemia    Anxiety    Bipolar depression (HCC)    Bipolar disorder (HCC)    Depression    Heart palpitations    HPV in female    IBS (irritable bowel syndrome)    Sleep apnea    Vaginal Pap smear, abnormal     Past Surgical History:  Procedure Laterality Date   CARPAL TUNNEL RELEASE  01/22/2012   Procedure: CARPAL TUNNEL RELEASE;  Surgeon: Tami Ribas, MD;  Location: [facility];  Service: Orthopedics;  Laterality: Right;  right carpal tunnel release   COLONOSCOPY     ORIF HUMERUS FRACTURE Right 03/12/2020   Procedure:  OPEN REDUCTION INTERNAL FIXATION (ORIF) DISTAL HUMERUS FRACTURE;  Surgeon: Myrene Galas, MD;  Location: [facility];  Service: Orthopedics;  Laterality: Right;    Social History Stacey Holmes  reports that she has never smoked. She has never used smokeless tobacco. She reports that she does not currently use alcohol. She reports that she does not use drugs.  family history includes Hypertension in her mother.  Allergies  Allergen Reactions   Dairy Aid [Tilactase] Other (See Comments)    Clogs sinuses   Gluten Meal Other (See Comments)    Affects mental status   Penicillins Hives and Rash    Has patient had a PCN reaction causing immediate rash, facial/tongue/throat swelling, SOB or lightheadedness with hypotension: Yes Has patient had a PCN reaction causing severe rash involving mucus membranes or skin necrosis: Yes Has patient had a PCN reaction that required hospitalization: No Has patient had a PCN reaction occurring within the last 10 years: No If all of the above answers are "NO", then may proceed with Cephalosporin use.    Pork-Derived Products Other (See Comments)    Joint pain       PHYSICAL EXAMINATION: Vital signs: BP 120/68   Pulse 75   Ht 5\' 10"  (1.778 m)   Wt 157 lb (71.2 kg)   SpO2 100%   BMI 22.53 kg/m   Constitutional: generally well-appearing, no acute distress Psychiatric: alert and oriented x3, cooperative Eyes: extraocular movements intact, anicteric, conjunctiva pink Mouth: oral pharynx moist, no lesions Neck: supple no lymphadenopathy Cardiovascular: heart regular rate and  rhythm, no murmur Lungs: clear to auscultation bilaterally Abdomen: soft, nontender, nondistended, no obvious ascites, no peritoneal signs, normal bowel sounds, no organomegaly Rectal: Omitted Extremities: no clubbing, cyanosis, or lower extremity edema bilaterally Skin: no lesions on visible extremities Neuro: No focal deficits.  Cranial nerves intact  ASSESSMENT:  1.  Irritable  bowel syndrome with alternating bowel habits.  Improved on Citrucel 2.  Chronic dyspepsia with bloating and belching, ongoing.  Negative workup to date including endoscopy, ultrasound, and celiac testing 3.  History of diminutive adenoma February 2024  PLAN:  1.  Continue Citrucel 2.  Prescribe metronidazole 250 mg p.o. 3 times daily x 10 days for possible bacterial overgrowth.  Medication effects and side effects reviewed 3.  Recommend IBgard.  Take one 10 minutes before meals.  Can pick up OTC 4.  Surveillance colonoscopy around 2031 5.  Routine GI follow-up 3 months Total time of 30 minutes was spent preparing to see the patient, obtaining interval history, performing medically appropriate physical examination, counseling and educating the patient regarding the above listed issues, ordering medication, arranging follow-up, and documenting clinical information in the health record

## 2023-10-20 ENCOUNTER — Encounter (INDEPENDENT_AMBULATORY_CARE_PROVIDER_SITE_OTHER): Payer: Self-pay

## 2024-01-18 IMAGING — CR DG HIP (WITH OR WITHOUT PELVIS) 2-3V*R*
3 series · 3 of 3 positions shown · non-contrast
Comparison: None.

CLINICAL DATA: RIGHT hip pain

EXAM:
DG HIP (WITH OR WITHOUT PELVIS) 2-3V RIGHT

[t pelvis a.p.]
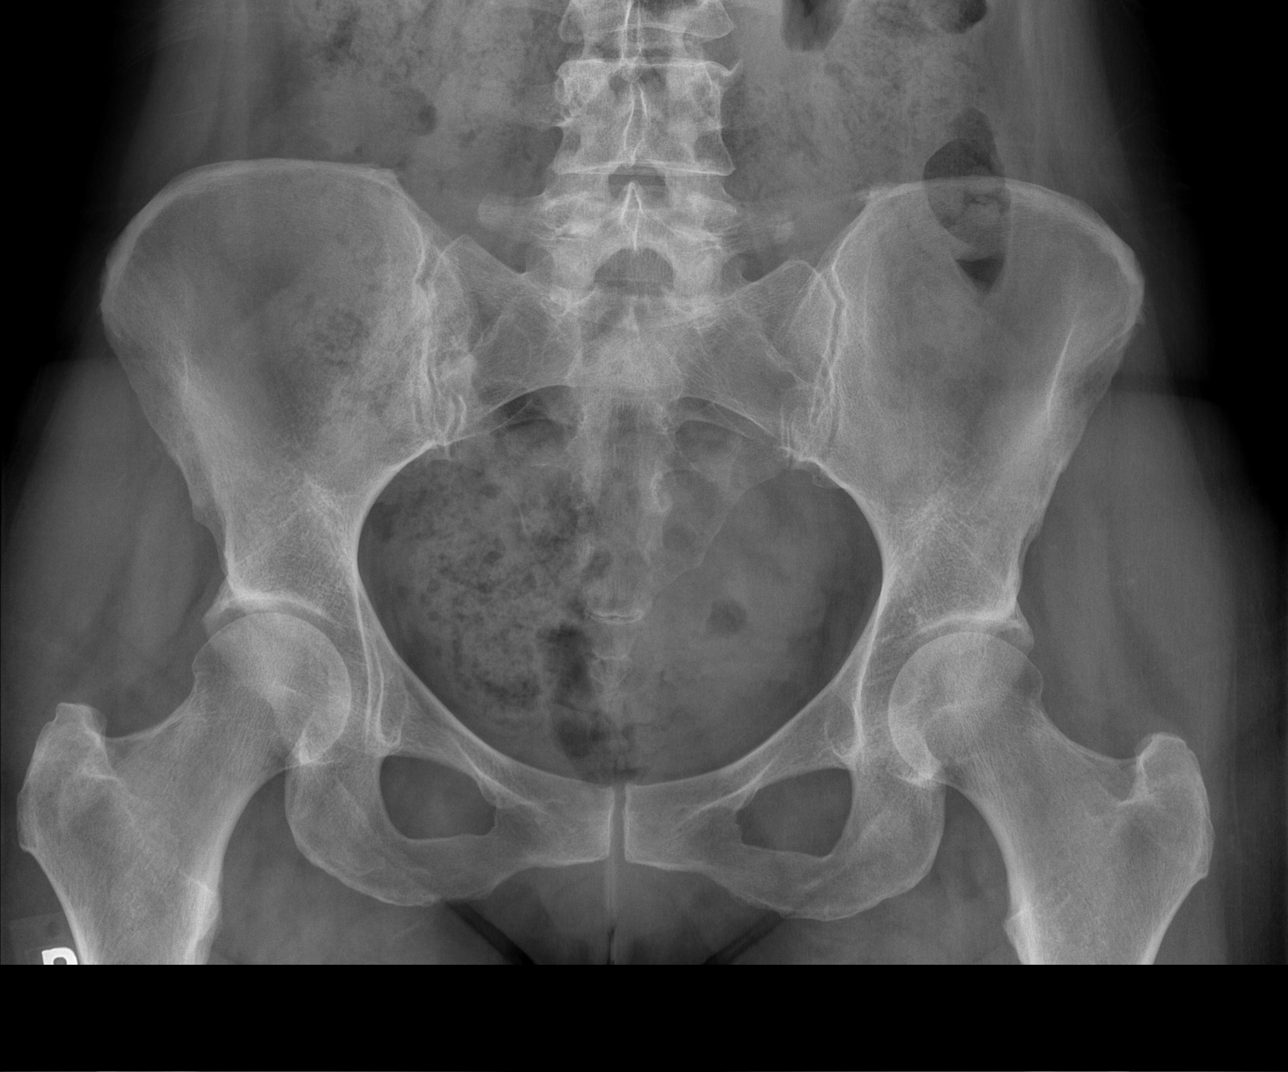

[t hip ap right]
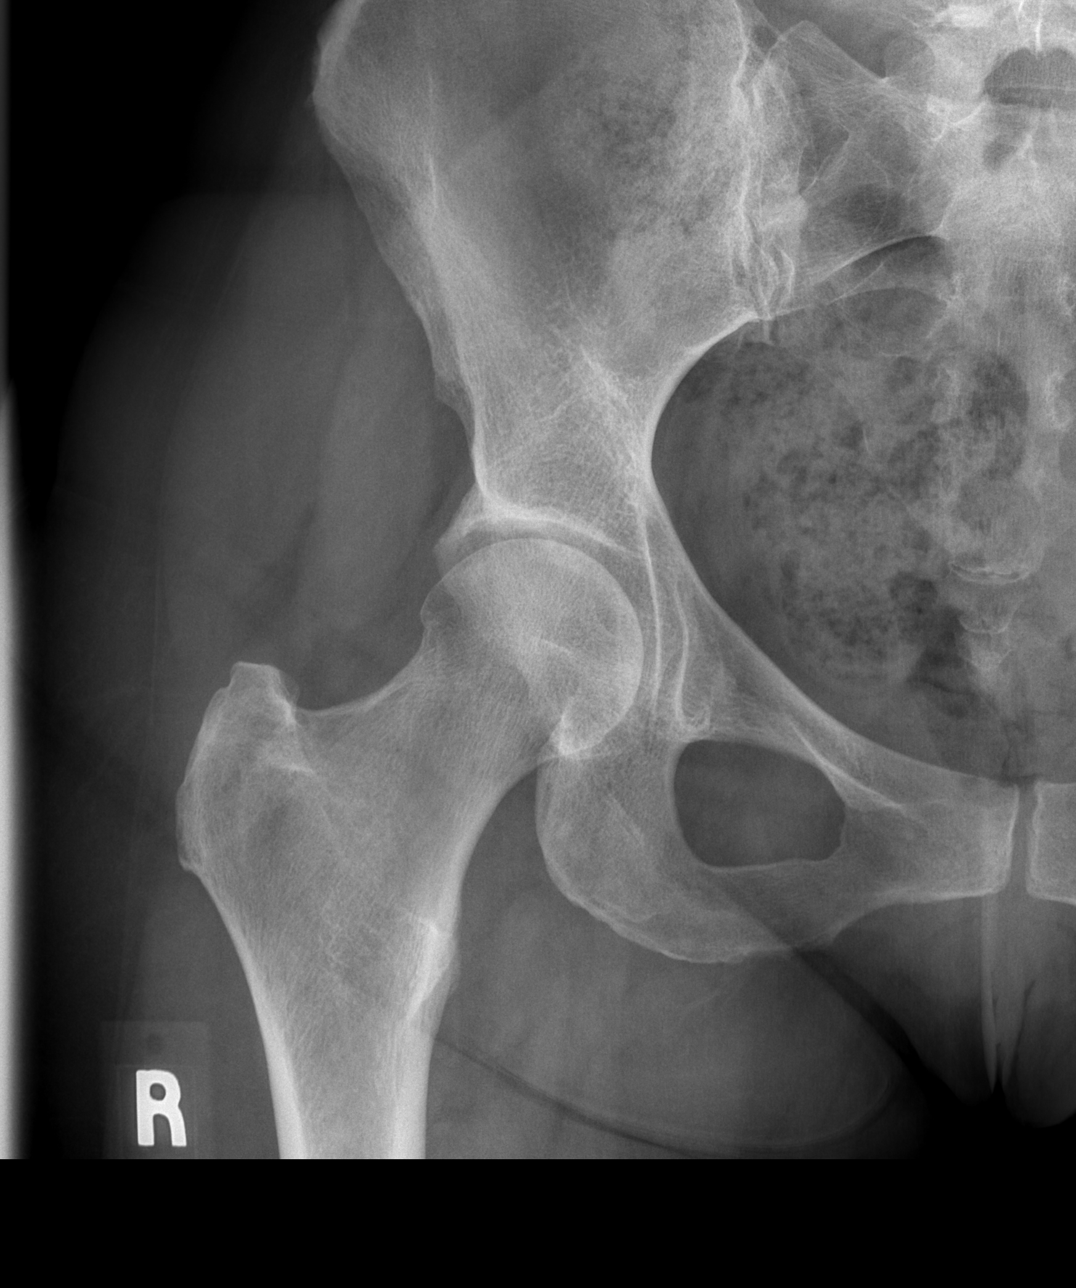

[t hip frog leg right]
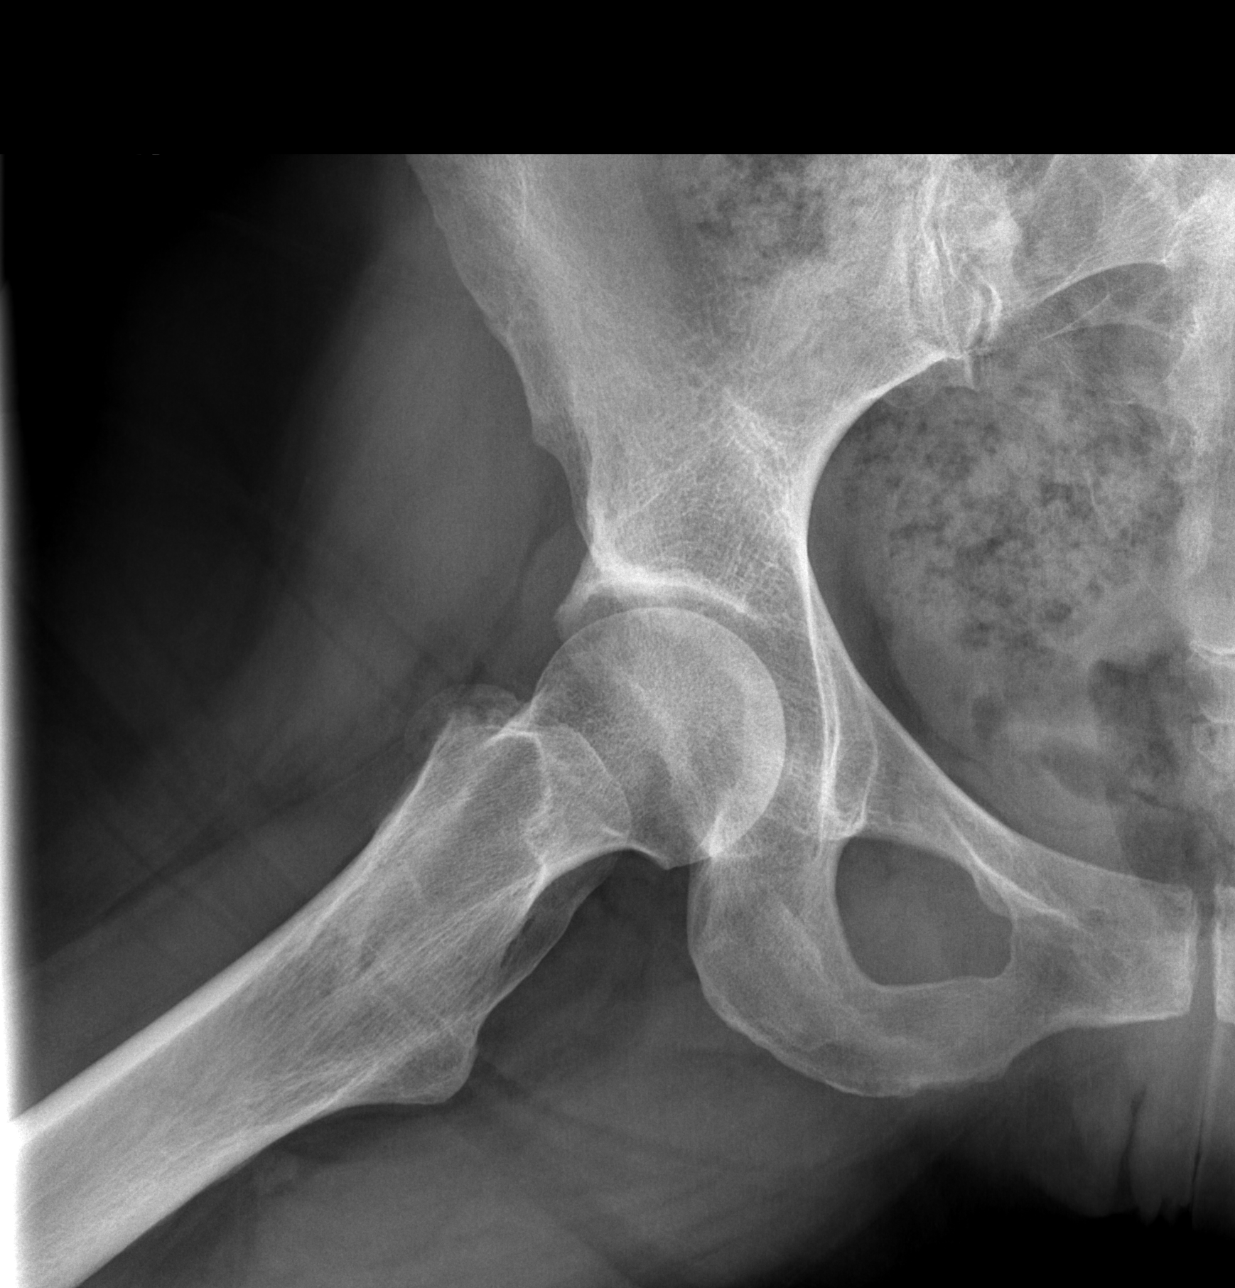

[3 of 3 positions shown; findings below may reference images not displayed]

FINDINGS: Hips are located. No evidence of pelvic fracture or sacral fracture.
Dedicated view of the RIGHT hip demonstrates no femoral neck
fracture.
IMPRESSION: No acute findings of the pelvis or RIGHT hip.

## 2024-01-27 ENCOUNTER — Other Ambulatory Visit (HOSPITAL_BASED_OUTPATIENT_CLINIC_OR_DEPARTMENT_OTHER): Payer: Self-pay | Admitting: *Deleted

## 2024-01-27 DIAGNOSIS — R1011 Right upper quadrant pain: Secondary | ICD-10-CM

## 2024-01-28 ENCOUNTER — Ambulatory Visit (HOSPITAL_BASED_OUTPATIENT_CLINIC_OR_DEPARTMENT_OTHER)
Admission: RE | Admit: 2024-01-28 | Discharge: 2024-01-28 | Disposition: A | Discharge status: Home / Self Care | Source: Ambulatory Visit | Attending: *Deleted | Admitting: *Deleted

## 2024-01-28 DIAGNOSIS — R1011 Right upper quadrant pain: Secondary | ICD-10-CM | POA: Insufficient documentation
